# Patient Record
Sex: Female | Born: 1935 | Race: White | Hispanic: No | Smoking: Never smoker
Health system: Southern US, Community
[De-identification: ages and names within clinical notes are randomized; demographics above are authoritative.]

## PROBLEM LIST (undated history)

## (undated) DIAGNOSIS — K219 Gastro-esophageal reflux disease without esophagitis: Secondary | ICD-10-CM

## (undated) DIAGNOSIS — M810 Age-related osteoporosis without current pathological fracture: Secondary | ICD-10-CM

## (undated) DIAGNOSIS — I10 Essential (primary) hypertension: Secondary | ICD-10-CM

## (undated) HISTORY — PX: CATARACT EXTRACTION: SUR2

## (undated) HISTORY — DX: Essential (primary) hypertension: I10

## (undated) HISTORY — DX: Age-related osteoporosis without current pathological fracture: M81.0

## (undated) HISTORY — DX: Gastro-esophageal reflux disease without esophagitis: K21.9

---

## 2020-09-15 ENCOUNTER — Other Ambulatory Visit: Payer: Self-pay

## 2020-09-15 ENCOUNTER — Emergency Department: Payer: Medicare Other

## 2020-09-15 ENCOUNTER — Emergency Department
Admission: EM | Admit: 2020-09-15 | Discharge: 2020-09-15 | Disposition: A | Discharge status: Home / Self Care | Payer: Medicare Other | Attending: Emergency Medicine | Admitting: Emergency Medicine

## 2020-09-15 DIAGNOSIS — K5900 Constipation, unspecified: Secondary | ICD-10-CM | POA: Diagnosis not present

## 2020-09-15 DIAGNOSIS — R509 Fever, unspecified: Secondary | ICD-10-CM | POA: Insufficient documentation

## 2020-09-15 DIAGNOSIS — W19XXXD Unspecified fall, subsequent encounter: Secondary | ICD-10-CM | POA: Diagnosis not present

## 2020-09-15 DIAGNOSIS — Z20822 Contact with and (suspected) exposure to covid-19: Secondary | ICD-10-CM | POA: Diagnosis not present

## 2020-09-15 DIAGNOSIS — E876 Hypokalemia: Secondary | ICD-10-CM | POA: Insufficient documentation

## 2020-09-15 DIAGNOSIS — R519 Headache, unspecified: Secondary | ICD-10-CM | POA: Insufficient documentation

## 2020-09-15 DIAGNOSIS — S199XXD Unspecified injury of neck, subsequent encounter: Secondary | ICD-10-CM | POA: Insufficient documentation

## 2020-09-15 DIAGNOSIS — R110 Nausea: Secondary | ICD-10-CM | POA: Insufficient documentation

## 2020-09-15 DIAGNOSIS — E860 Dehydration: Secondary | ICD-10-CM | POA: Insufficient documentation

## 2020-09-15 LAB — URINALYSIS, COMPLETE (UACMP) WITH MICROSCOPIC
Bilirubin Urine: NEGATIVE
Glucose, UA: NEGATIVE mg/dL
Ketones, ur: 20 mg/dL — AB
Leukocytes,Ua: NEGATIVE
Nitrite: NEGATIVE
Protein, ur: NEGATIVE mg/dL
Specific Gravity, Urine: 1.023 (ref 1.005–1.030)
pH: 5 (ref 5.0–8.0)

## 2020-09-15 LAB — COMPREHENSIVE METABOLIC PANEL
ALT: 13 U/L (ref 0–44)
AST: 22 U/L (ref 15–41)
Albumin: 3.8 g/dL (ref 3.5–5.0)
Alkaline Phosphatase: 56 U/L (ref 38–126)
Anion gap: 12 (ref 5–15)
BUN: 20 mg/dL (ref 8–23)
CO2: 23 mmol/L (ref 22–32)
Calcium: 9 mg/dL (ref 8.9–10.3)
Chloride: 91 mmol/L — ABNORMAL LOW (ref 98–111)
Creatinine, Ser: 0.69 mg/dL (ref 0.44–1.00)
GFR, Estimated: >60 mL/min (ref >60)
Glucose, Bld: 95 mg/dL (ref 70–99)
Potassium: 2.9 mmol/L — ABNORMAL LOW (ref 3.5–5.1)
Sodium: 126 mmol/L — ABNORMAL LOW (ref 135–145)
Total Bilirubin: 1 mg/dL (ref 0.3–1.2)
Total Protein: 6.8 g/dL (ref 6.5–8.1)

## 2020-09-15 LAB — RESP PANEL BY RT-PCR (FLU A&B, COVID) ARPGX2
Influenza A by PCR: NEGATIVE
Influenza B by PCR: NEGATIVE
SARS Coronavirus 2 by RT PCR: NEGATIVE

## 2020-09-15 LAB — CBC
HCT: 37.7 % (ref 36.0–46.0)
Hemoglobin: 13.4 g/dL (ref 12.0–15.0)
MCH: 31 pg (ref 26.0–34.0)
MCHC: 35.5 g/dL (ref 30.0–36.0)
MCV: 87.3 fL (ref 80.0–100.0)
Platelets: 246 10*3/uL (ref 150–400)
RBC: 4.32 MIL/uL (ref 3.87–5.11)
RDW: 12.6 % (ref 11.5–15.5)
WBC: 7.7 10*3/uL (ref 4.0–10.5)
nRBC: 0 % (ref 0.0–0.2)

## 2020-09-15 LAB — LIPASE, BLOOD: Lipase: 43 U/L (ref 11–51)

## 2020-09-15 LAB — TROPONIN I (HIGH SENSITIVITY): Troponin I (High Sensitivity): 6 ng/L (ref <18)

## 2020-09-15 LAB — CK: Total CK: 33 U/L — ABNORMAL LOW (ref 38–234)

## 2020-09-15 MED ORDER — POTASSIUM CHLORIDE CRYS ER 20 MEQ PO TBCR
80.0000 meq | EXTENDED_RELEASE_TABLET | Freq: Once | ORAL | Status: AC
  Administered 2020-09-15: 80 meq via ORAL
  Filled 2020-09-15: qty 4

## 2020-09-15 MED ORDER — SENNA 8.6 MG PO TABS
1.0000 | ORAL_TABLET | Freq: Once | ORAL | Status: AC
  Administered 2020-09-15: 8.6 mg via ORAL
  Filled 2020-09-15: qty 1

## 2020-09-15 MED ORDER — ONDANSETRON HCL 4 MG PO TABS: 4.0000 mg | ORAL_TABLET | Freq: Three times a day (TID) | ORAL | 0 refills | Status: DC | PRN

## 2020-09-15 MED ORDER — POLYETHYLENE GLYCOL 3350 17 G PO PACK: 17.0000 g | PACK | Freq: Every day | ORAL | 0 refills | Status: AC

## 2020-09-15 MED ORDER — ONDANSETRON HCL 4 MG/2ML IJ SOLN
4.0000 mg | Freq: Once | INTRAMUSCULAR | Status: AC
  Administered 2020-09-15: 4 mg via INTRAVENOUS
  Filled 2020-09-15: qty 2

## 2020-09-15 MED ORDER — LACTATED RINGERS IV BOLUS
500.0000 mL | Freq: Once | INTRAVENOUS | Status: AC
  Administered 2020-09-15: 500 mL via INTRAVENOUS

## 2020-09-15 NOTE — ED Provider Notes (Signed)
[facility] Emergency Department Provider Note  ____________________________________________   Event Date/Time   First MD Initiated Contact with Patient 09/15/20 [phone]     (approximate)  I have reviewed the triage vital signs and the nursing notes.   HISTORY  Chief Complaint Nausea   HPI Connie Schwartz is a 87 y.o. female reportedly with a past medical history of several vertebral cervical body fractures after recent fall several weeks ago (patient states she does not know exactly when she fell and was seen at [facility] before being transferred to [facility] clinic) currently residing at home and fairly independent wearing a c-collar and T SLO brace at all times presents for assessment of some nausea she experienced when she woke up this morning feeling hot and sweaty.  She thinks she may have had a little burning with urination of the last day but is not sure if this was related to some vaginal dryness or not.  She also notes she has not involvement in a little over a week and is not sure if this was related to pain medicine she was on or not.  She denies any recent falls or subsequent falls after being discharged from [place] clinic several weeks ago.  She denies any chest pain, cough, shortness of breath, headache, earache, sore throat, diarrhea, blood in her urine, rash or extremity pain weakness numbness or tingling.  She does endorse some pain in her back and neck from her prior fall but no acute change.         No past medical history on file.  There are no problems to display for this patient.     Prior to Admission medications   Medication Sig Start Date End Date Taking? Authorizing Provider  ondansetron (ZOFRAN) 4 MG tablet Take 1 tablet (4 mg total) by mouth every 8 (eight) hours as needed for up to 10 doses for nausea or vomiting. 09/15/20  Yes Gilles Chiquito, MD  polyethylene glycol (MIRALAX) 17 g packet Take 17 g by mouth daily. 09/15/20  10/15/20 Yes Gilles Chiquito, MD    Allergies Celebrex [celecoxib], Bactrim [sulfamethoxazole-trimethoprim], Doxycycline, and Penicillins  No family history on file.  Social History    Review of Systems  Review of Systems  Constitutional: Positive for fever ( subjective) and malaise/fatigue. Negative for chills.  HENT: Negative for sore throat.   Eyes: Positive for double vision ( "for several weeks, only when both eyes open). Negative for pain.  Respiratory: Negative for cough and stridor.   Cardiovascular: Negative for chest pain.  Gastrointestinal: Positive for nausea. Negative for vomiting.  Genitourinary: Positive for dysuria.  Musculoskeletal: Positive for back pain ( over last several weeks since fall) and neck pain ( over last several weeks since fall).  Skin: Negative for rash.  Neurological: Negative for seizures, loss of consciousness and headaches.  Psychiatric/Behavioral: Negative for suicidal ideas.  All other systems reviewed and are negative.     ____________________________________________   PHYSICAL EXAM:  VITAL SIGNS: ED Triage Vitals  Enc Vitals Group     BP 09/15/20 0947 (!) 143/118     Pulse Rate 09/15/20 0947 100     Resp 09/15/20 0947 16     Temp 09/15/20 0947 97.9 F (36.6 C)     Temp Source 09/15/20 0947 Oral     SpO2 09/15/20 0947 96 %     Weight 09/15/20 0944 105 lb (47.6 kg)     Height 09/15/20 0944 5\' 3"  (1.6 m)  Head Circumference --      Peak Flow --      Pain Score 09/15/20 0944 0     Pain Loc --      Pain Edu? --      Excl. in GC? --    Vitals:   09/15/20 1330 09/15/20 1400  BP: (!) 143/60 (!) 136/50  Pulse: 67 75  Resp: 17 20  Temp:    SpO2: 100% 100%   Physical Exam Vitals and nursing note reviewed.  Constitutional:      General: She is not in acute distress.    Appearance: She is well-developed.  HENT:     Head: Normocephalic and atraumatic.     Right Ear: External ear normal.     Left Ear: External ear  normal.     Nose: Nose normal.  Eyes:     Conjunctiva/sclera: Conjunctivae normal.  Cardiovascular:     Rate and Rhythm: Normal rate and regular rhythm.     Heart sounds: No murmur heard.   Pulmonary:     Effort: Pulmonary effort is normal. No respiratory distress.     Breath sounds: Normal breath sounds.  Abdominal:     Palpations: Abdomen is soft.     Tenderness: There is no abdominal tenderness.  Musculoskeletal:     Cervical back: Neck supple.  Skin:    General: Skin is warm and dry.     Capillary Refill: Capillary refill takes less than 2 seconds.  Neurological:     Mental Status: She is alert and oriented to person, place, and time.  Psychiatric:        Mood and Affect: Mood normal.     No focal tenderness over the CT or L-spine.  2+ bilateral radial pulses.  PERRLA.  EOMI.  Cranial nerves II through XII grossly intact.  Patient has full and symmetric strength on her bilateral upper and lower extremities.  ____________________________________________   LABS (all labs ordered are listed, but only abnormal results are displayed)  Labs Reviewed  COMPREHENSIVE METABOLIC PANEL - Abnormal; Notable for the following components:      Result Value   Sodium 126 (*)    Potassium 2.9 (*)    Chloride 91 (*)    All other components within normal limits  URINALYSIS, COMPLETE (UACMP) WITH MICROSCOPIC - Abnormal; Notable for the following components:   Color, Urine YELLOW (*)    APPearance HAZY (*)    Hgb urine dipstick MODERATE (*)    Ketones, ur 20 (*)    Bacteria, UA FEW (*)    All other components within normal limits  CK - Abnormal; Notable for the following components:   Total CK 33 (*)    All other components within normal limits  RESP PANEL BY RT-PCR (FLU A&B, COVID) ARPGX2  LIPASE, BLOOD  CBC  TROPONIN I (HIGH SENSITIVITY)  TROPONIN I (HIGH SENSITIVITY)   ____________________________________________  EKG  Sinus rhythm with a ventricular rate of 88, normal  axis, unremarkable intervals, no clear evidence of acute ischemia or other significant underlying arrhythmia. ____________________________________________  RADIOLOGY  ED MD interpretation: Chest x-ray is unremarkable as is CT head.  Official radiology report(s): DG Chest 2 View  Result Date: 09/15/2020 CLINICAL DATA:  Nausea.  To kidney a. Weakness. EXAM: CHEST - 2 VIEW COMPARISON:  None. FINDINGS: Artifact overlies the chest. Heart size is normal. Aortic atherosclerotic calcification is present. Pulmonary vascularity is normal. The lungs are clear. No infiltrate, collapse or effusion. No significant bone finding.  IMPRESSION: No active disease. Aortic atherosclerosis. Electronically Signed   By: Paulina Fusi M.D.   On: 09/15/2020 13:02   CT Head Wo Contrast  Result Date: 09/15/2020 CLINICAL DATA:  Nauseated and weak EXAM: CT HEAD WITHOUT CONTRAST TECHNIQUE: Contiguous axial images were obtained from the base of the skull through the vertex without intravenous contrast. COMPARISON:  None. FINDINGS: Brain: No evidence of acute infarction, hemorrhage, extra-axial collection, ventriculomegaly, or mass effect. Generalized cerebral atrophy. Periventricular white matter low attenuation likely secondary to microangiopathy. Vascular: Cerebrovascular atherosclerotic calcifications are noted. Skull: Negative for fracture or focal lesion. Sinuses/Orbits: Visualized portions of the orbits are unremarkable. Visualized portions of the paranasal sinuses are unremarkable. Visualized portions of the mastoid air cells are unremarkable. Other: None. IMPRESSION: 1. No acute intracranial pathology. 2. Chronic microvascular disease and cerebral atrophy. Electronically Signed   By: Elige Ko   On: 09/15/2020 13:45    ____________________________________________   PROCEDURES  Procedure(s) performed (including Critical Care):  .1-3 Lead EKG Interpretation Performed by: Gilles Chiquito, MD Authorized by: Gilles Chiquito, MD     Interpretation: normal     ECG rate assessment: normal     Rhythm: sinus rhythm     Ectopy: none     Conduction: normal       ____________________________________________   INITIAL IMPRESSION / ASSESSMENT AND PLAN / ED COURSE       Patient presents with above history and exam for assessment of some nausea and subjective fevers today.  This is in the setting of spine fractures from recent fall.  Patient is somewhat poor historian but denies any other clear acute symptoms.  She is in a c-collar and she has a low brace although unable to view any records from [place].  Initial differential includes acute infectious process, metabolic derangements, atypical presentation for ACS, arrhythmia, possible ileus.  Low suspicion for SBO as patient states she is passing gas and has not actually vomited and her abdomen is soft throughout.  However she does endorse significant constipation and I discussed importance of initiating a bowel regimen with Colace senna and MiraLAX.  Patient does not think she has had any recent falls she is a somewhat poor historian and a CT head was obtained that did not show any evidence of acute injury.  In addition chest x-ray shows no evidence of pneumonia or other acute intrathoracic process to explain patient's nausea.  CMP remarkable for mild hyponatremia with NA 126 and hypokalemia with a potassium of 2.9 with no other significant electrolyte or metabolic derangements.  No evidence of cholestasis or hepatitis.  Patient has no tenderness on upper quadrant to suggest symptomatic gallstones.  Lipase is 43 not consistent with acute pancreatitis.  CBC shows no leukocytosis or acute anemia.  UA has some hemoglobin and ketones consistent with mild dehydration but no evidence of infection.  Given absence of fever elevated white blood cell count and no other foci of infection on exam or chest x-ray were low suspicion for acute infectious process.  Troponin is  nonelevated at 6 and this was obtained greater than 3 hours after symptom onset and overall I have very low suspicion for ACS given multiple reassuring EKG.  CK is unremarkable.  Certainly possible patient is little dehydrated and has a small ileus although she is tolerating p.o.  She was given some Zofran and had resolution of her nausea.  Given stable vitals with otherwise reassuring exam and work-up and patient tolerating p.o. and making flatus without  any abdominal pain I believe she is safe for discharge with plan for continued outpatient PCP follow-up.  Discharge stable condition.  Strict return cautions advised and discussed.  Emphasized importance of adequate hydration and initiation of bowel regimen including MiraLAX and Colace.  Patient and family at bedside voiced understanding and agreement with this plan.  Discharged stable condition.  Strict impressions advised and discussed after some IV fluids and Zofran.      ____________________________________________   FINAL CLINICAL IMPRESSION(S) / ED DIAGNOSES  Final diagnoses:  Constipation, unspecified constipation type  Nausea  Subjective fever  Injury of neck, subsequent encounter  Hypokalemia  Dehydration    Medications  lactated ringers bolus 500 mL (0 mLs Intravenous Stopped 09/15/20 1403)  potassium chloride SA (KLOR-CON) CR tablet 80 mEq (80 mEq Oral Given 09/15/20 1247)  ondansetron (ZOFRAN) injection 4 mg (4 mg Intravenous Given 09/15/20 1247)  senna (SENOKOT) tablet 8.6 mg (8.6 mg Oral Given 09/15/20 1420)     ED Discharge Orders         Ordered    ondansetron (ZOFRAN) 4 MG tablet  Every 8 hours PRN        09/15/20 1404    polyethylene glycol (MIRALAX) 17 g packet  Daily        09/15/20 1404           Note:  This document was prepared using Dragon voice recognition software and may include unintentional dictation errors.   Gilles ChiquitoSmith, Everlee Quakenbush P, MD 09/15/20 [phone]

## 2020-09-15 NOTE — ED Triage Notes (Signed)
Pt to ED via POV, pt states that she woke up this morning and felt very hot. Pt reports that she has also been feeling nauseated and weak. Pt family reports no recent BMs in the last week and a half. Pt recently hospitalized for neck fracture. Pt denies taking narcotic pain medication for the neck pain. Pt denies abdominal pain at this time.

## 2020-09-18 DIAGNOSIS — S22010A Wedge compression fracture of first thoracic vertebra, initial encounter for closed fracture: Secondary | ICD-10-CM | POA: Insufficient documentation

## 2020-09-18 DIAGNOSIS — S129XXA Fracture of neck, unspecified, initial encounter: Secondary | ICD-10-CM | POA: Insufficient documentation

## 2020-09-25 ENCOUNTER — Telehealth: Payer: Self-pay

## 2020-09-25 NOTE — Telephone Encounter (Signed)
Has new pt appt with [facility] in june  Copied from CRM #[id number]. Topic: General - Other >> Sep 22, 2020  4:15 PM Pawlus, Gifford Shave wrote: Pts daughter called stating they really need to get Home Health care orders and cannot wait until June, please advise if this can be done. >> Sep 22, 2020  6:07 PM Pawlus, Maxine Glenn A wrote: Pt is over the age of 57 but needed to get home health orders put in place.

## 2020-09-25 NOTE — Telephone Encounter (Signed)
Called gave message

## 2020-09-26 ENCOUNTER — Ambulatory Visit: Payer: PRIVATE HEALTH INSURANCE | Admitting: Family Medicine

## 2020-12-22 ENCOUNTER — Ambulatory Visit: Payer: Medicare Other | Admitting: Internal Medicine

## 2021-01-12 DIAGNOSIS — K589 Irritable bowel syndrome without diarrhea: Secondary | ICD-10-CM | POA: Insufficient documentation

## 2021-01-12 DIAGNOSIS — K219 Gastro-esophageal reflux disease without esophagitis: Secondary | ICD-10-CM | POA: Insufficient documentation

## 2022-03-20 IMAGING — CR DG CHEST 2V
2 series · 2 of 2 positions shown · non-contrast
Comparison: None.

CLINICAL DATA: Nausea.  To kidney a. Weakness.

EXAM:
CHEST - 2 VIEW

[chest lat]
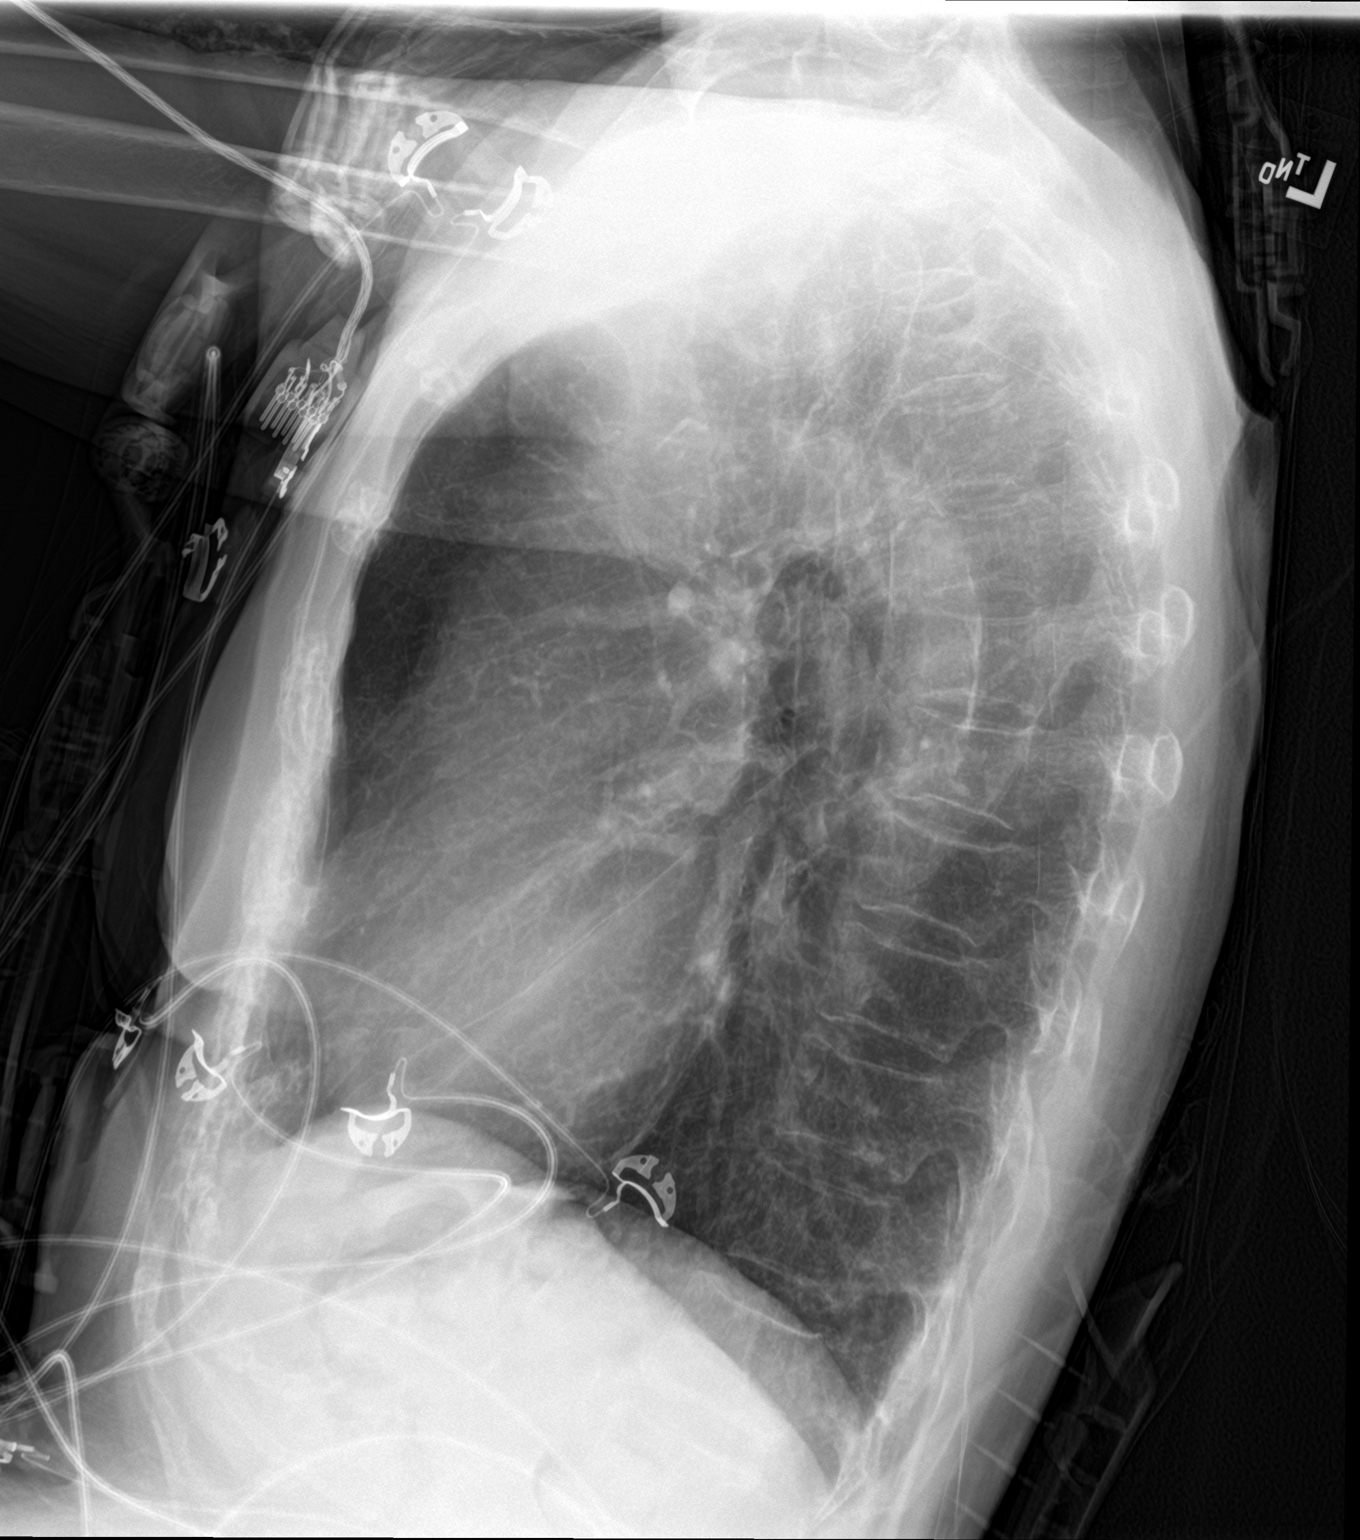

[chest ap]
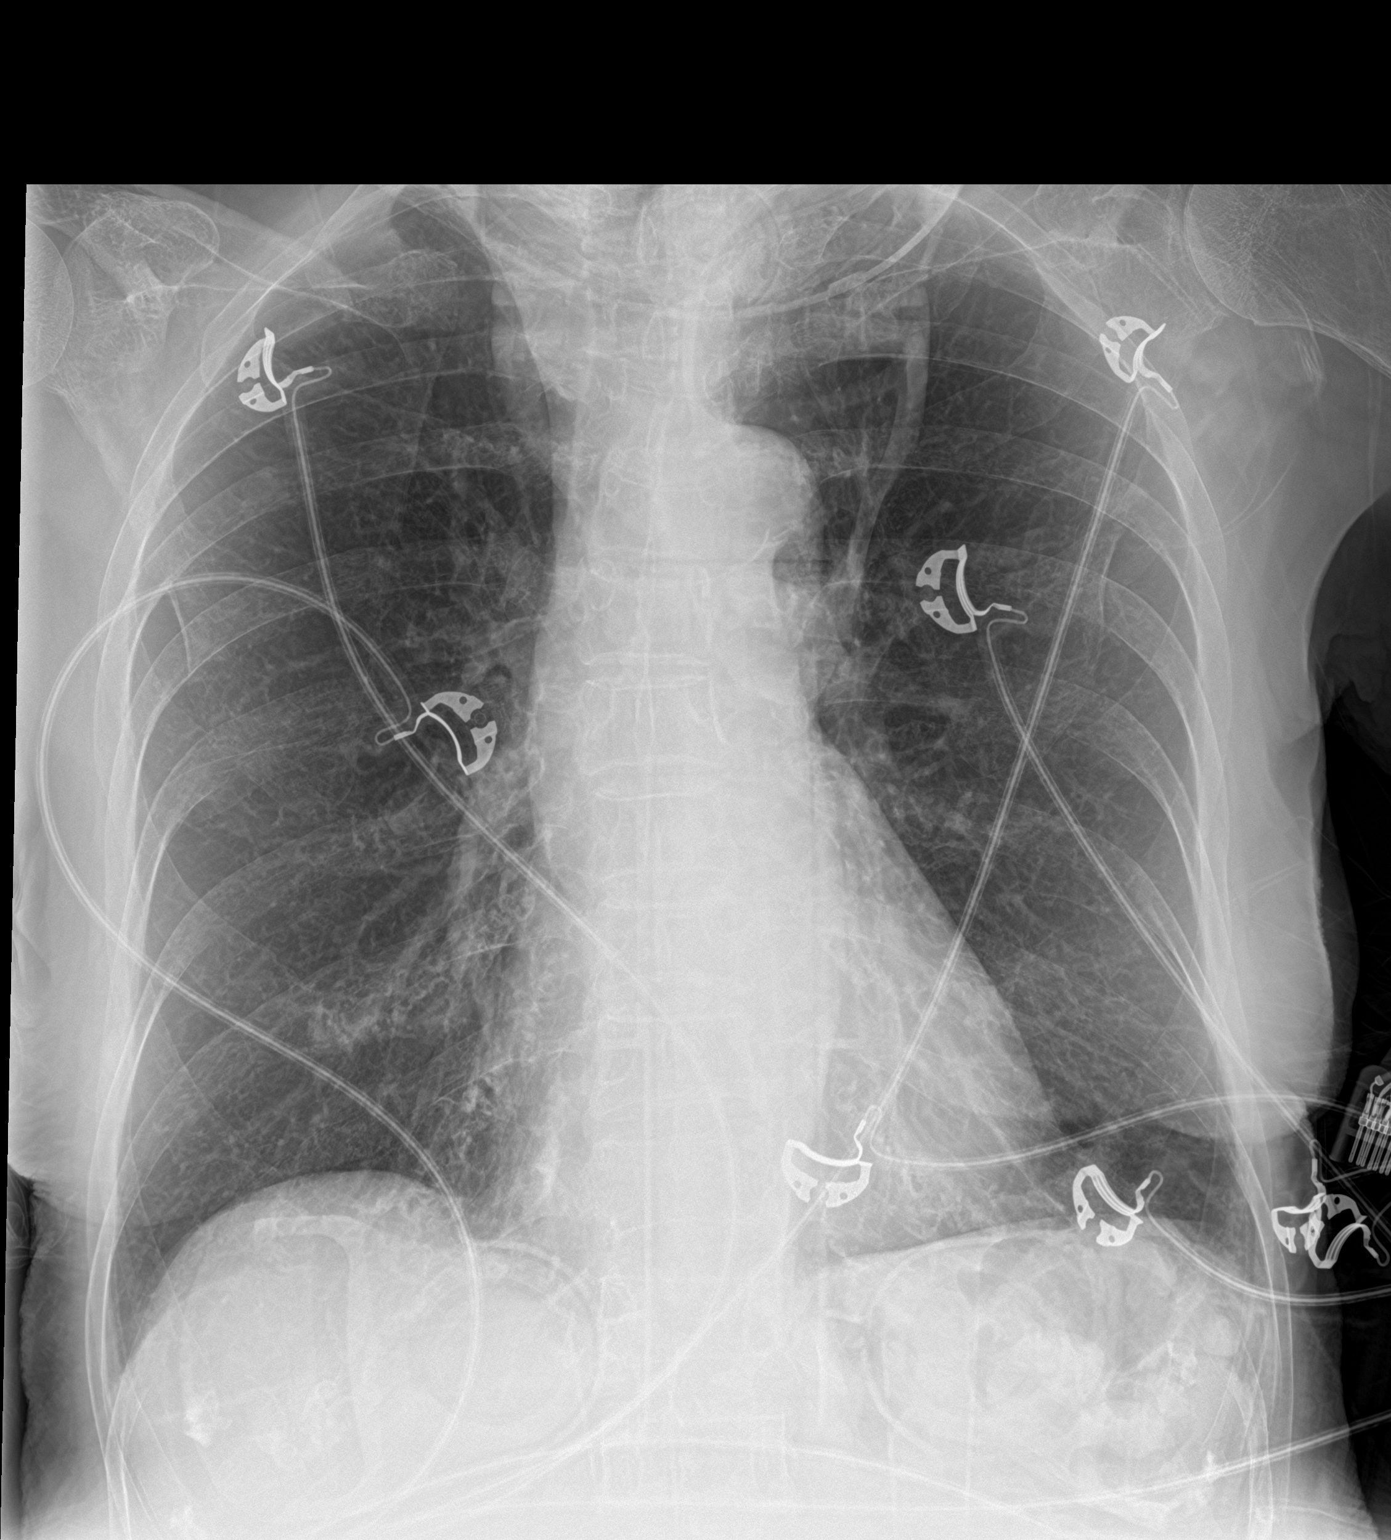

[2 of 2 positions shown; findings below may reference images not displayed]

FINDINGS: Artifact overlies the chest. Heart size is normal. Aortic
atherosclerotic calcification is present. Pulmonary vascularity is
normal. The lungs are clear. No infiltrate, collapse or effusion. No
significant bone finding.
IMPRESSION: No active disease. Aortic atherosclerosis.

## 2022-03-20 IMAGING — CT CT HEAD W/O CM
3 series · 16 of 47 positions shown, 19 images · non-contrast
Comparison: None.

CLINICAL DATA: Nauseated and weak

EXAM:
CT HEAD WITHOUT CONTRAST
TECHNIQUE: Contiguous axial images were obtained from the base of the skull
through the vertex without intravenous contrast.

[Series 3: head wo · axial · 0.44mm/px · z∈[-93,+32]mm · 10 of 31 slices shown, 13 images]
[im 3/31  brain]
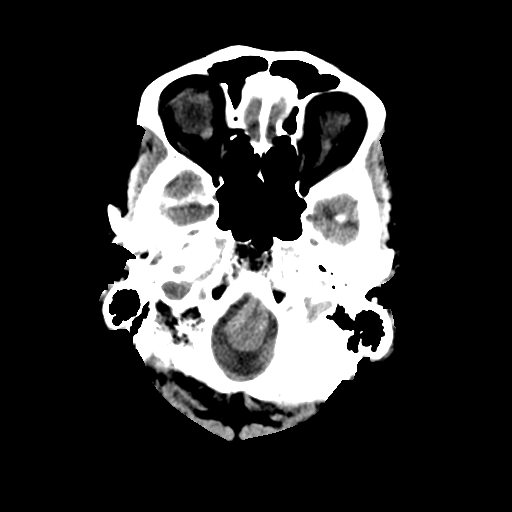
[im 3/31  bone]
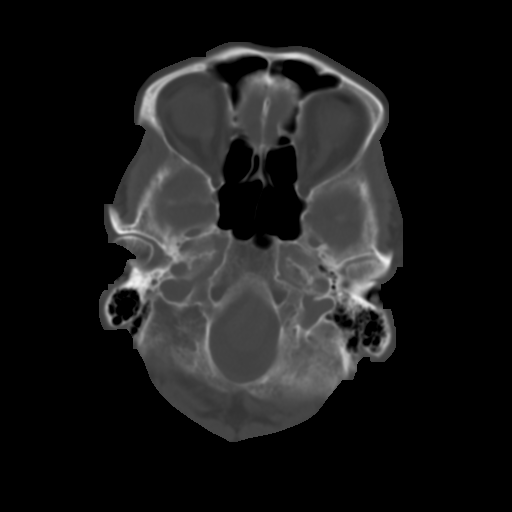
[im 6/31  brain]
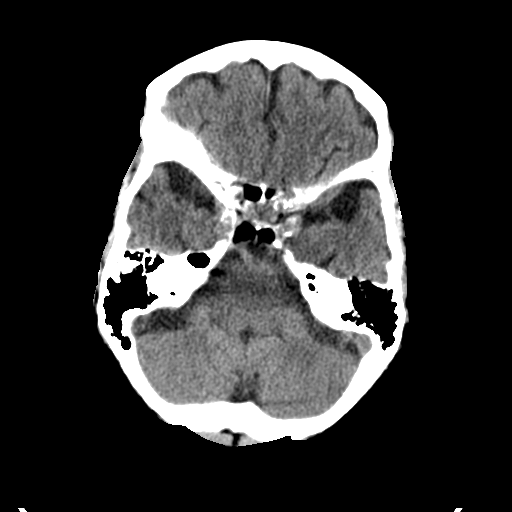
[im 9/31  brain]
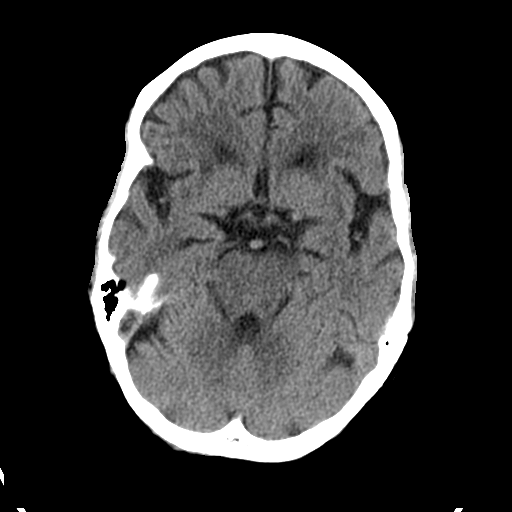
[im 11/31  brain]
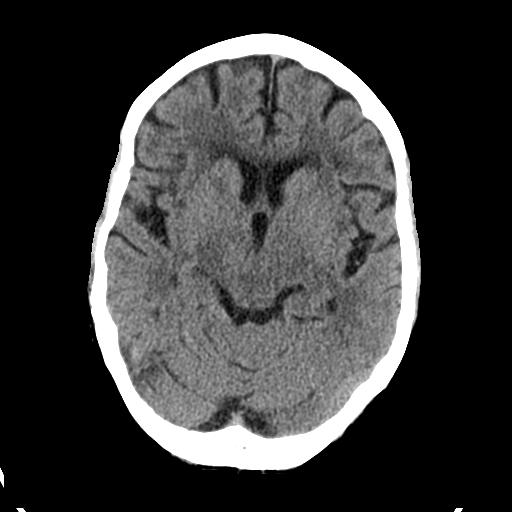
[im 14/31  brain]
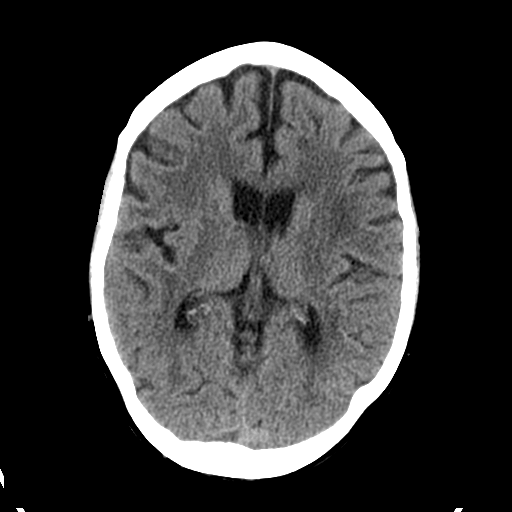
[im 14/31  bone]
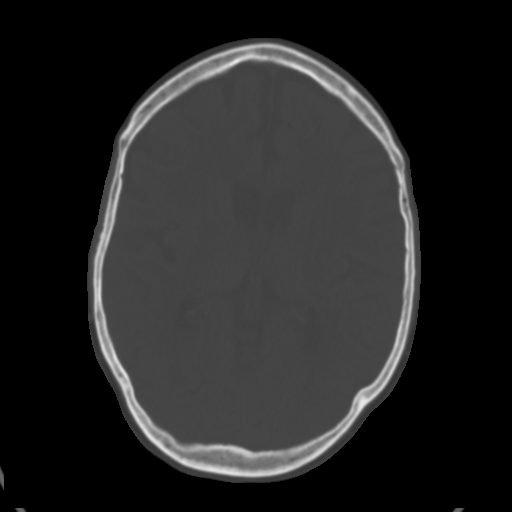
[im 17/31  brain]
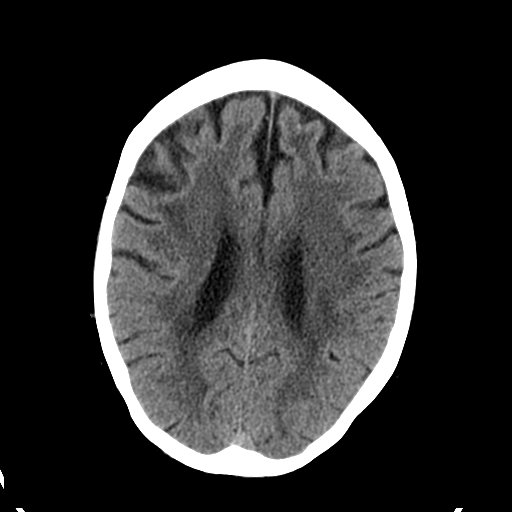
[im 20/31  brain]
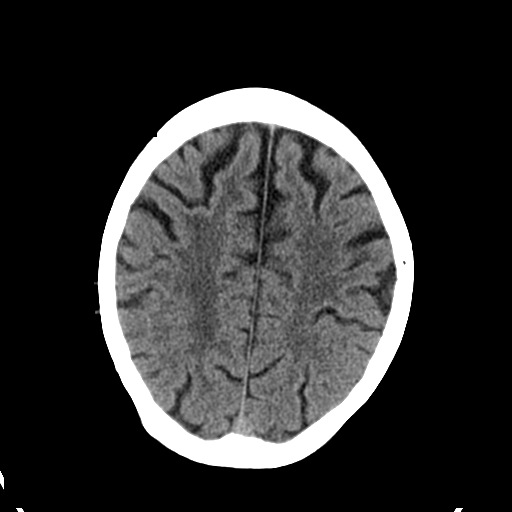
[im 23/31  brain]
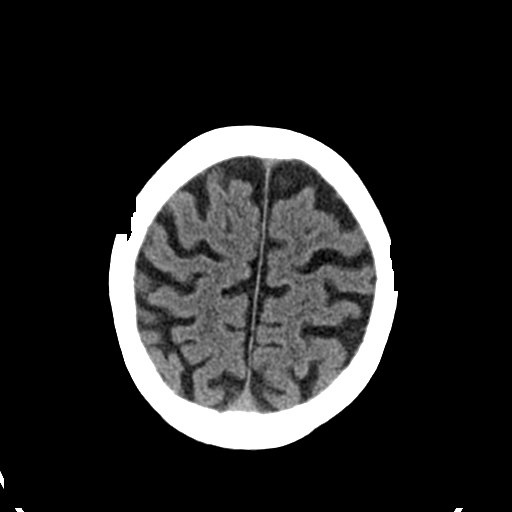
[im 25/31  brain]
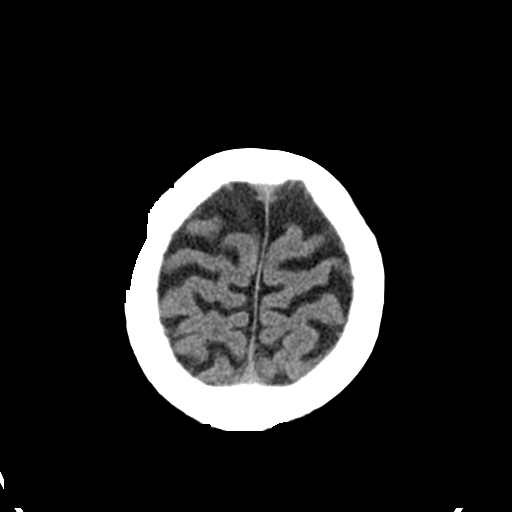
[im 25/31  bone]
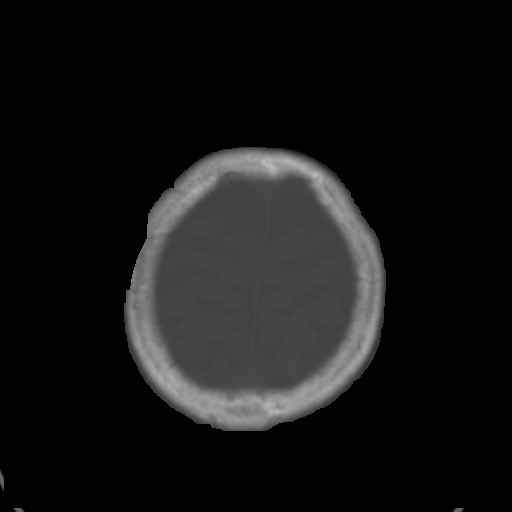
[im 28/31  brain]
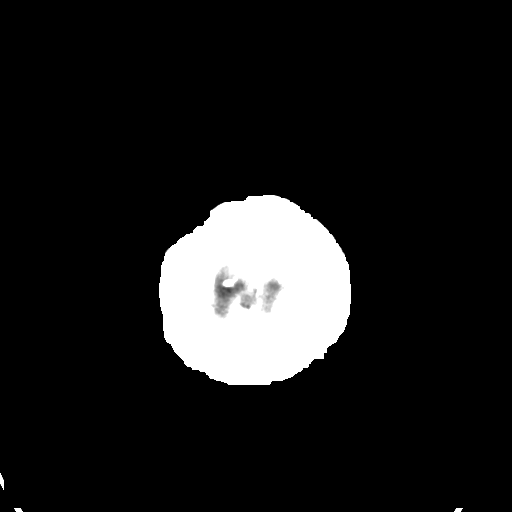

[Series 4: coronal soft tissue · coronal · 0.34mm/px · 3 of 68 slices shown]
[im 23/68  brain]
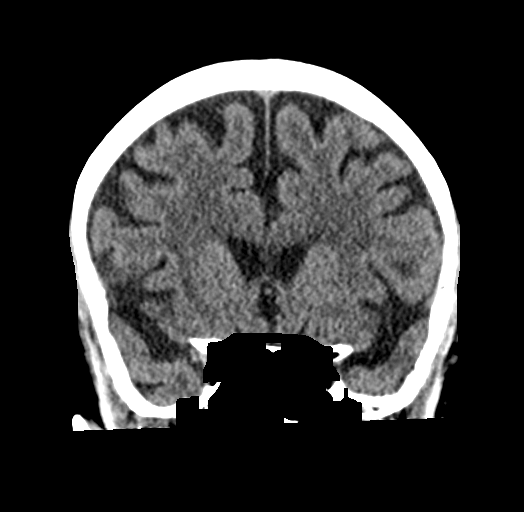
[im 30/68  brain]
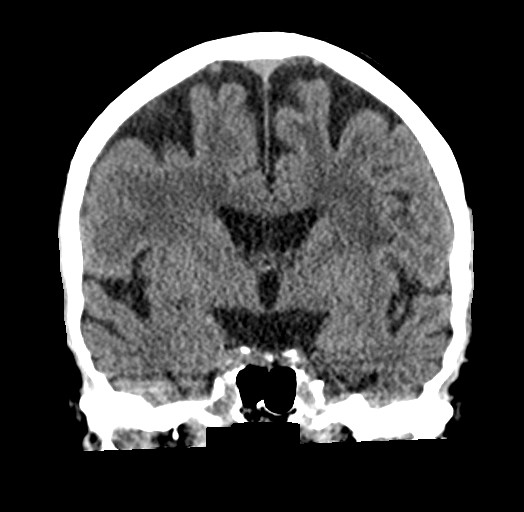
[im 38/68  brain]
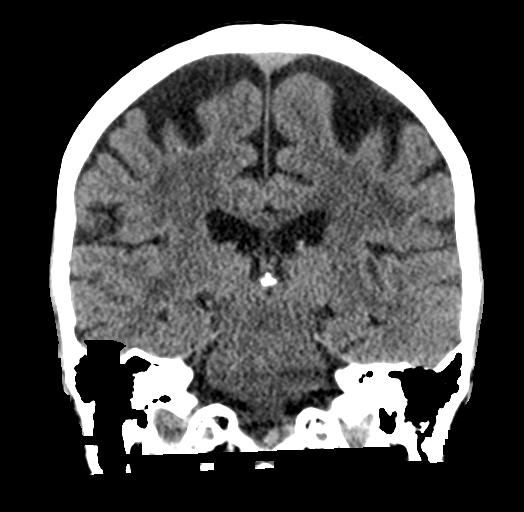

[Series 5: sagittal soft tissue · sagittal · 0.34mm/px · 3 of 53 slices shown]
[im 18/53  brain]
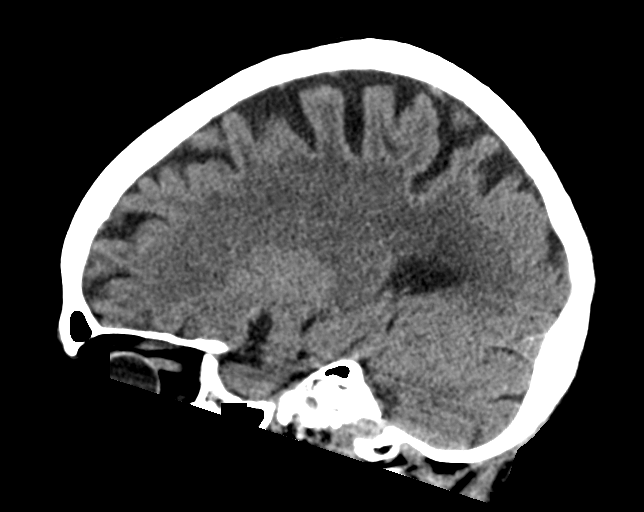
[im 27/53  brain]
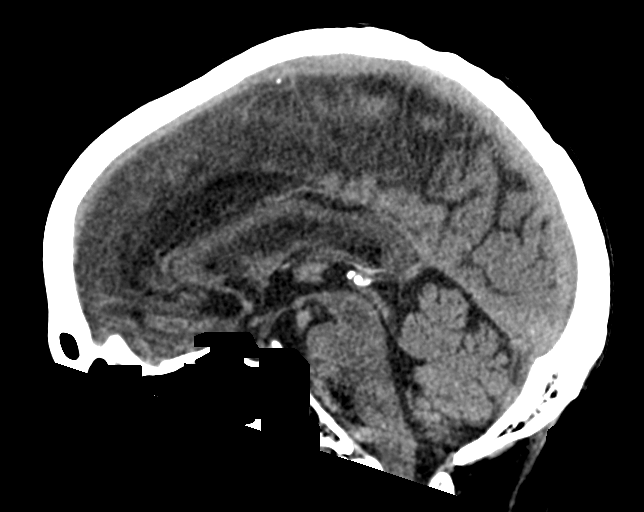
[im 35/53  brain]
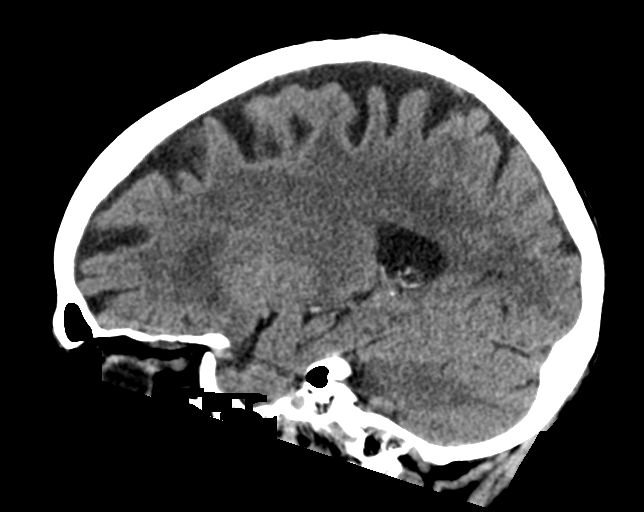

[16 of 47 positions shown; findings below may reference images not displayed]

FINDINGS: Brain: No evidence of acute infarction, hemorrhage, extra-axial
collection, ventriculomegaly, or mass effect. Generalized cerebral
atrophy. Periventricular white matter low attenuation likely
secondary to microangiopathy.

Vascular: Cerebrovascular atherosclerotic calcifications are noted.

Skull: Negative for fracture or focal lesion.

Sinuses/Orbits: Visualized portions of the orbits are unremarkable.
Visualized portions of the paranasal sinuses are unremarkable.
Visualized portions of the mastoid air cells are unremarkable.

Other: None.
IMPRESSION: 1. No acute intracranial pathology.
2. Chronic microvascular disease and cerebral atrophy.

## 2022-12-25 ENCOUNTER — Ambulatory Visit: Payer: Medicare Other | Attending: Internal Medicine | Admitting: Internal Medicine

## 2022-12-25 ENCOUNTER — Encounter: Payer: Self-pay | Admitting: Internal Medicine

## 2022-12-25 VITALS — BP 152/70 | HR 70 | Ht 62.5 in | Wt 117.0 lb

## 2022-12-25 DIAGNOSIS — R931 Abnormal findings on diagnostic imaging of heart and coronary circulation: Secondary | ICD-10-CM | POA: Diagnosis not present

## 2022-12-25 DIAGNOSIS — R0602 Shortness of breath: Secondary | ICD-10-CM | POA: Diagnosis present

## 2022-12-25 DIAGNOSIS — I10 Essential (primary) hypertension: Secondary | ICD-10-CM | POA: Diagnosis not present

## 2022-12-25 NOTE — Patient Instructions (Signed)
Medication Instructions:  Your physician recommends that you continue on your current medications as directed. Please refer to the Current Medication list given to you today.    *If you need a refill on your cardiac medications before your next appointment, please call your pharmacy*   Lab Work: None ordered today   Testing/Procedures: Your physician has requested that you have an echocardiogram. Echocardiography is a painless test that uses sound waves to create images of your heart. It provides your doctor with information about the size and shape of your heart and how well your heart's chambers and valves are working.   You may receive an ultrasound enhancing agent through an IV if needed to better visualize your heart during the echo. This procedure takes approximately one hour.  There are no restrictions for this procedure.  This will take place at [address], [place] [address]  Follow-Up: At [facility], you and your health needs are our priority.  As part of our continuing mission to provide you with exceptional heart care, we have created designated Provider Care Teams.  These Care Teams include your primary Cardiologist (physician) and Advanced Practice Providers (APPs -  Physician Assistants and Nurse Practitioners) who all work together to provide you with the care you need, when you need it.  We recommend signing up for the patient portal called "MyChart".  Sign up information is provided on this After Visit Summary.  MyChart is used to connect with patients for Virtual Visits (Telemedicine).  Patients are able to view lab/test results, encounter notes, upcoming appointments, etc.  Non-urgent messages can be sent to your provider as well.   To learn more about what you can do with MyChart, go to [email or web address].    Your next appointment:   1 month(s)  Provider:   You may see Yvonne Kendall, MD or one of the following  Advanced Practice Providers on your designated Care Team:   Nicolasa Ducking, NP Eula Listen, PA-C Cadence Fransico Michael, PA-C Charlsie Quest, NP

## 2022-12-25 NOTE — Progress Notes (Unsigned)
New Outpatient Visit Date: 12/25/2022  Referring Provider: Gwenevere Ghazi, NP [facility]  Chief Complaint: Abnormal echocardiogram  HPI:  Connie Schwartz is a 89 y.o. female who is being seen today for the evaluation of moderate basal septal hypertrophy noted on echocardiogram at the request of Connie Schwartz. She has a history of hypertension, GERD, and osteoporosis complicated by multiple compression fractures in the setting of falls.  Today, she feels well, complaining only of mild shortness of breath that she describes as sometimes having difficulty taking a deep breath.  She denies chest pain, palpitations, lightheadedness, and edema.  She mentions several times that she has intermittent headaches, most pronounced when she lies back in bed.  She does not recall why echocardiograms have been performed at her assisted living facility.  Formal echo reports and images are not available for review today.  She denies a history of heart disease.  --------------------------------------------------------------------------------------------------  Cardiovascular History & Procedures: Cardiovascular Problems: Shortness of breath Question abnormal echocardiogram  Risk Factors: Hypertension and age greater than 78  Cath/PCI: None  CV Surgery: None  EP Procedures and Devices: None  Non-Invasive Evaluation(s): None  Recent CV Pertinent Labs: Lab Results  Component Value Date   K 2.9 (L) 09/15/2020   BUN 20 09/15/2020   CREATININE 0.69 09/15/2020    --------------------------------------------------------------------------------------------------  Past Medical History:  Diagnosis Date   GERD (gastroesophageal reflux disease)    Hypertension    Osteoporosis     Past Surgical History:  Procedure Laterality Date   CATARACT EXTRACTION Bilateral     Current Meds  Medication Sig   alendronate (FOSAMAX) 70 MG tablet Take 70 mg by mouth once a week.   amLODipine  (NORVASC) 2.5 MG tablet Take 2.5 mg by mouth daily.   Biotin 2500 MCG CHEW Chew 1 tablet by mouth daily.   Calcium Citrate-Vitamin D (CITRACAL + D PO) Take 1 tablet by mouth daily.   Cholecalciferol (VITAMIN D3) 50 MCG (2000 UT) capsule Take 2,000 Units by mouth daily.   fluticasone (FLONASE) 50 MCG/ACT nasal spray Place 1 spray into both nostrils daily.   pantoprazole (PROTONIX) 40 MG tablet Take 40 mg by mouth daily.   raloxifene (EVISTA) 60 MG tablet Take 60 mg by mouth daily.   senna (SENOKOT) 8.6 MG tablet Take 1 tablet by mouth 2 (two) times daily.    Allergies: Celebrex [celecoxib], Bactrim [sulfamethoxazole-trimethoprim], Doxycycline, and Penicillins  Social History   Tobacco Use   Smoking status: Never   Smokeless tobacco: Never  Vaping Use   Vaping Use: Never used  Substance Use Topics   Alcohol use: Never   Drug use: Never    Family History  Problem Relation Age of Onset   Heart failure Mother 41   Multiple myeloma Father    Cancer Sister     Review of Systems: A 12-system review of systems was performed and was negative except as noted in the HPI.  --------------------------------------------------------------------------------------------------  Physical Exam: BP (!) 160/80 (BP Location: Right Arm, Patient Position: Sitting, Cuff Size: Normal)   Pulse 70   Ht 5' 2.5" (1.588 m)   Wt 117 lb (53.1 kg)   SpO2 97%   BMI 21.06 kg/m  Repeat BP: 152/70  General:  NAD HEENT: No conjunctival pallor or scleral icterus. Neck: Supple without lymphadenopathy, thyromegaly, JVD, or HJR. No carotid bruit. Lungs: Normal work of breathing. Clear to auscultation bilaterally without wheezes or crackles. Heart: Regular rate and rhythm without murmurs, rubs, or gallops. Non-displaced  PMI. Abd: Bowel sounds present. Soft, NT/ND without hepatosplenomegaly Ext: No lower extremity edema. Radial, PT, and DP pulses are 2+ bilaterally Skin: Warm and dry without rash. Neuro:  CNIII-XII intact. Strength and fine-touch sensation intact in upper and lower extremities bilaterally. Psych: Normal mood and affect.  EKG:  Normal sinus rhythm without abnormalities.  Lab Results  Component Value Date   WBC 7.7 09/15/2020   HGB 13.4 09/15/2020   HCT 37.7 09/15/2020   MCV 87.3 09/15/2020   PLT 246 09/15/2020    Lab Results  Component Value Date   NA 126 (L) 09/15/2020   K 2.9 (L) 09/15/2020   CL 91 (L) 09/15/2020   CO2 23 09/15/2020   BUN 20 09/15/2020   CREATININE 0.69 09/15/2020   GLUCOSE 95 09/15/2020   ALT 13 09/15/2020    --------------------------------------------------------------------------------------------------  ASSESSMENT AND PLAN: Shortness of breath and abnormal echocardiogram: [name] reports sporadic shortness of breath but otherwise has been asymptomatic.  Outside notes indicate moderate basal septal hypertrophy on recent echocardiogram though images and formal report are not available for review.  This would not be an unexpected finding in a 89 year old with history of hypertension.  We discussed symptomatic monitoring versus repeat echocardiogram in our office and have agreed to the latter.  No additional intervention recommended at this time.  Hypertension: Blood pressure moderately elevated today.  No outside blood pressure readings provided.  We will defer changes to her current antihypertensive regimen consisting of amlodipine today, though escalation will need to be considered at follow-up visit if blood pressure remains suboptimally controlled.  Continued blood pressure surveillance at [facility] also recommended.  Follow-up: Return to clinic after completion of echocardiogram.  Yvonne Kendall, MD 12/25/2022 10:12 AM

## 2022-12-26 ENCOUNTER — Encounter: Payer: Self-pay | Admitting: Internal Medicine

## 2022-12-26 DIAGNOSIS — R931 Abnormal findings on diagnostic imaging of heart and coronary circulation: Secondary | ICD-10-CM | POA: Insufficient documentation

## 2022-12-26 DIAGNOSIS — R0602 Shortness of breath: Secondary | ICD-10-CM | POA: Insufficient documentation

## 2022-12-26 DIAGNOSIS — I10 Essential (primary) hypertension: Secondary | ICD-10-CM | POA: Insufficient documentation

## 2022-12-27 ENCOUNTER — Ambulatory Visit: Payer: Medicare Other | Attending: Internal Medicine

## 2022-12-27 DIAGNOSIS — R0602 Shortness of breath: Secondary | ICD-10-CM | POA: Diagnosis not present

## 2022-12-28 LAB — ECHOCARDIOGRAM COMPLETE
AR max vel: 3.03 cm2
AV Area VTI: 2.84 cm2
AV Area mean vel: 2.84 cm2
AV Mean grad: 3 mmHg
AV Peak grad: 5.3 mmHg
AV Vena cont: 0.4 cm
Ao pk vel: 1.15 m/s
Area-P 1/2: 3.17 cm2
Calc EF: 58.9 %
S' Lateral: 2.2 cm
Single Plane A2C EF: 57.5 %
Single Plane A4C EF: 55.6 %

## 2023-01-17 ENCOUNTER — Ambulatory Visit: Payer: PRIVATE HEALTH INSURANCE | Admitting: Nurse Practitioner

## 2023-03-26 ENCOUNTER — Ambulatory Visit: Payer: Medicare Other | Admitting: Internal Medicine

## 2023-04-22 ENCOUNTER — Other Ambulatory Visit: Payer: Self-pay

## 2023-04-22 ENCOUNTER — Emergency Department: Payer: Medicare Other

## 2023-04-22 ENCOUNTER — Inpatient Hospital Stay
Admission: EM | Admit: 2023-04-22 | Discharge: 2023-04-25 | DRG: 309 | Disposition: A | Discharge status: Skilled Nursing Facility | Payer: Medicare Other | Source: Skilled Nursing Facility | Attending: Internal Medicine | Admitting: Internal Medicine

## 2023-04-22 DIAGNOSIS — M4316 Spondylolisthesis, lumbar region: Secondary | ICD-10-CM | POA: Diagnosis present

## 2023-04-22 DIAGNOSIS — M8088XA Other osteoporosis with current pathological fracture, vertebra(e), initial encounter for fracture: Secondary | ICD-10-CM | POA: Diagnosis present

## 2023-04-22 DIAGNOSIS — S22000A Wedge compression fracture of unspecified thoracic vertebra, initial encounter for closed fracture: Secondary | ICD-10-CM | POA: Insufficient documentation

## 2023-04-22 DIAGNOSIS — Z7983 Long term (current) use of bisphosphonates: Secondary | ICD-10-CM | POA: Diagnosis not present

## 2023-04-22 DIAGNOSIS — R1310 Dysphagia, unspecified: Secondary | ICD-10-CM | POA: Diagnosis present

## 2023-04-22 DIAGNOSIS — Z9842 Cataract extraction status, left eye: Secondary | ICD-10-CM | POA: Diagnosis not present

## 2023-04-22 DIAGNOSIS — S22000S Wedge compression fracture of unspecified thoracic vertebra, sequela: Secondary | ICD-10-CM | POA: Diagnosis not present

## 2023-04-22 DIAGNOSIS — Z602 Problems related to living alone: Secondary | ICD-10-CM | POA: Diagnosis present

## 2023-04-22 DIAGNOSIS — Z79899 Other long term (current) drug therapy: Secondary | ICD-10-CM

## 2023-04-22 DIAGNOSIS — W19XXXA Unspecified fall, initial encounter: Secondary | ICD-10-CM

## 2023-04-22 DIAGNOSIS — E7800 Pure hypercholesterolemia, unspecified: Secondary | ICD-10-CM | POA: Diagnosis present

## 2023-04-22 DIAGNOSIS — K219 Gastro-esophageal reflux disease without esophagitis: Secondary | ICD-10-CM | POA: Diagnosis present

## 2023-04-22 DIAGNOSIS — I959 Hypotension, unspecified: Secondary | ICD-10-CM | POA: Diagnosis not present

## 2023-04-22 DIAGNOSIS — Y92239 Unspecified place in hospital as the place of occurrence of the external cause: Secondary | ICD-10-CM | POA: Diagnosis present

## 2023-04-22 DIAGNOSIS — I4891 Unspecified atrial fibrillation: Principal | ICD-10-CM | POA: Diagnosis present

## 2023-04-22 DIAGNOSIS — Z7901 Long term (current) use of anticoagulants: Secondary | ICD-10-CM | POA: Diagnosis not present

## 2023-04-22 DIAGNOSIS — Z8249 Family history of ischemic heart disease and other diseases of the circulatory system: Secondary | ICD-10-CM

## 2023-04-22 DIAGNOSIS — Z66 Do not resuscitate: Secondary | ICD-10-CM | POA: Diagnosis present

## 2023-04-22 DIAGNOSIS — I480 Paroxysmal atrial fibrillation: Secondary | ICD-10-CM | POA: Diagnosis present

## 2023-04-22 DIAGNOSIS — R296 Repeated falls: Secondary | ICD-10-CM | POA: Diagnosis not present

## 2023-04-22 DIAGNOSIS — Z9841 Cataract extraction status, right eye: Secondary | ICD-10-CM

## 2023-04-22 DIAGNOSIS — I10 Essential (primary) hypertension: Secondary | ICD-10-CM | POA: Diagnosis present

## 2023-04-22 DIAGNOSIS — M810 Age-related osteoporosis without current pathological fracture: Secondary | ICD-10-CM | POA: Insufficient documentation

## 2023-04-22 DIAGNOSIS — I2489 Other forms of acute ischemic heart disease: Secondary | ICD-10-CM | POA: Diagnosis present

## 2023-04-22 DIAGNOSIS — Z880 Allergy status to penicillin: Secondary | ICD-10-CM | POA: Diagnosis not present

## 2023-04-22 DIAGNOSIS — Z888 Allergy status to other drugs, medicaments and biological substances status: Secondary | ICD-10-CM

## 2023-04-22 LAB — BASIC METABOLIC PANEL
Anion gap: 13 (ref 5–15)
BUN: 13 mg/dL (ref 8–23)
CO2: 21 mmol/L — ABNORMAL LOW (ref 22–32)
Calcium: 8.7 mg/dL — ABNORMAL LOW (ref 8.9–10.3)
Chloride: 102 mmol/L (ref 98–111)
Creatinine, Ser: 0.92 mg/dL (ref 0.44–1.00)
GFR, Estimated: 58 mL/min — ABNORMAL LOW (ref >60)
Glucose, Bld: 119 mg/dL — ABNORMAL HIGH (ref 70–99)
Potassium: 3.5 mmol/L (ref 3.5–5.1)
Sodium: 136 mmol/L (ref 135–145)

## 2023-04-22 LAB — CBC
HCT: 36.4 % (ref 36.0–46.0)
Hemoglobin: 12 g/dL (ref 12.0–15.0)
MCH: 29.6 pg (ref 26.0–34.0)
MCHC: 33 g/dL (ref 30.0–36.0)
MCV: 89.9 fL (ref 80.0–100.0)
Platelets: 236 10*3/uL (ref 150–400)
RBC: 4.05 MIL/uL (ref 3.87–5.11)
RDW: 14.6 % (ref 11.5–15.5)
WBC: 7 10*3/uL (ref 4.0–10.5)
nRBC: 0 % (ref 0.0–0.2)

## 2023-04-22 LAB — T4, FREE: Free T4: 1.27 ng/dL — ABNORMAL HIGH (ref 0.61–1.12)

## 2023-04-22 LAB — PROTIME-INR
INR: 1 (ref 0.8–1.2)
Prothrombin Time: 13.9 s (ref 11.4–15.2)

## 2023-04-22 LAB — URINALYSIS, ROUTINE W REFLEX MICROSCOPIC
Bilirubin Urine: NEGATIVE
Glucose, UA: NEGATIVE mg/dL
Hgb urine dipstick: NEGATIVE
Ketones, ur: 5 mg/dL — AB
Leukocytes,Ua: NEGATIVE
Nitrite: NEGATIVE
Protein, ur: NEGATIVE mg/dL
Specific Gravity, Urine: 1.006 (ref 1.005–1.030)
pH: 8 (ref 5.0–8.0)

## 2023-04-22 LAB — HEPARIN LEVEL (UNFRACTIONATED): Heparin Unfractionated: 0.57 [IU]/mL (ref 0.30–0.70)

## 2023-04-22 LAB — APTT: aPTT: 28 s (ref 24–36)

## 2023-04-22 LAB — CBG MONITORING, ED: Glucose-Capillary: 100 mg/dL — ABNORMAL HIGH (ref 70–99)

## 2023-04-22 LAB — TSH: TSH: 5.622 u[IU]/mL — ABNORMAL HIGH (ref 0.350–4.500)

## 2023-04-22 LAB — TROPONIN I (HIGH SENSITIVITY)
Troponin I (High Sensitivity): 15 ng/L (ref <18)
Troponin I (High Sensitivity): 305 ng/L (ref <18)
Troponin I (High Sensitivity): 906 ng/L (ref <18)

## 2023-04-22 LAB — MAGNESIUM: Magnesium: 1.9 mg/dL (ref 1.7–2.4)

## 2023-04-22 MED ORDER — SODIUM CHLORIDE 0.9 % IV SOLN: 250.0000 mL | INTRAVENOUS | Status: AC | PRN

## 2023-04-22 MED ORDER — SODIUM CHLORIDE 0.9% FLUSH: 3.0000 mL | INTRAVENOUS | Status: DC | PRN

## 2023-04-22 MED ORDER — SODIUM CHLORIDE 0.9% FLUSH
3.0000 mL | Freq: Two times a day (BID) | INTRAVENOUS | Status: DC
  Administered 2023-04-22 – 2023-04-24 (×6): 3 mL via INTRAVENOUS

## 2023-04-22 MED ORDER — HEPARIN BOLUS VIA INFUSION
2500.0000 [IU] | Freq: Once | INTRAVENOUS | Status: AC
  Administered 2023-04-22: 2500 [IU] via INTRAVENOUS
  Filled 2023-04-22: qty 2500

## 2023-04-22 MED ORDER — PANTOPRAZOLE SODIUM 40 MG PO TBEC
40.0000 mg | DELAYED_RELEASE_TABLET | Freq: Every day | ORAL | Status: DC
  Administered 2023-04-22 – 2023-04-25 (×4): 40 mg via ORAL
  Filled 2023-04-22 (×4): qty 1

## 2023-04-22 MED ORDER — DILTIAZEM LOAD VIA INFUSION
10.0000 mg | Freq: Once | INTRAVENOUS | Status: AC
  Administered 2023-04-22: 10 mg via INTRAVENOUS
  Filled 2023-04-22: qty 10

## 2023-04-22 MED ORDER — DILTIAZEM HCL-DEXTROSE 125-5 MG/125ML-% IV SOLN (PREMIX)
5.0000 mg/h | INTRAVENOUS | Status: DC
  Administered 2023-04-22: 10 mg/h via INTRAVENOUS
  Administered 2023-04-22: 20 mg/h via INTRAVENOUS
  Administered 2023-04-22: 5 mg/h via INTRAVENOUS
  Filled 2023-04-22 (×2): qty 125

## 2023-04-22 MED ORDER — RALOXIFENE HCL 60 MG PO TABS
60.0000 mg | ORAL_TABLET | Freq: Every day | ORAL | Status: DC
  Administered 2023-04-22: 60 mg via ORAL
  Filled 2023-04-22: qty 1

## 2023-04-22 MED ORDER — HEPARIN (PORCINE) 25000 UT/250ML-% IV SOLN
800.0000 [IU]/h | INTRAVENOUS | Status: DC
  Administered 2023-04-22: 800 [IU]/h via INTRAVENOUS
  Filled 2023-04-22: qty 250

## 2023-04-22 MED ORDER — ROSUVASTATIN CALCIUM 10 MG PO TABS
20.0000 mg | ORAL_TABLET | Freq: Every day | ORAL | Status: DC
  Administered 2023-04-23 – 2023-04-25 (×3): 20 mg via ORAL
  Filled 2023-04-22 (×3): qty 2

## 2023-04-22 NOTE — Consult Note (Signed)
PHARMACY - ANTICOAGULATION CONSULT NOTE  Pharmacy Consult for heparin infusion Indication: atrial fibrillation  Allergies  Allergen Reactions   Celebrex [Celecoxib]    Bactrim [Sulfamethoxazole-Trimethoprim]    Doxycycline    Penicillins     Patient Measurements: Height: 5' 2.5" (158.8 cm) Weight: 54 kg (119 lb 0.8 oz) IBW/kg (Calculated) : 51.25 Heparin Dosing Weight: 54 kg  Vital Signs: Temp: 98.3 F (36.8 C) (10/15 1929) Temp Source: Oral (10/15 1631) BP: 123/58 (10/15 1929) Pulse Rate: 81 (10/15 1929)  Labs: Recent Labs    04/22/23 0413 04/22/23 0638 04/22/23 1023 04/22/23 1919  HGB 12.0  --   --   --   HCT 36.4  --   --   --   PLT 236  --   --   --   APTT  --   --  28  --   LABPROT  --   --  13.9  --   INR  --   --  1.0  --   HEPARINUNFRC  --   --   --  0.57  CREATININE 0.92  --   --   --   TROPONINIHS 15 305* 906*  --     Estimated Creatinine Clearance: 31.6 mL/min (by C-G formula based on SCr of 0.92 mg/dL).   Medical History: Past Medical History:  Diagnosis Date   GERD (gastroesophageal reflux disease)    Hypertension    Osteoporosis     Medications:  No AC prior to admission.  Assessment: Patient admitted to ED with lightheadedness. PMH includes htn, osteoporosis, and compression fractures. Pharmacy consulted to manage heparin infusion for new onset Afib.  Baseline CBC appropriate to initiate heparin therapy. Baseline INR and aPTT also ordered, pending collection.  Date/Time  HL 10/15 1919  0.57  Goal of Therapy:  Heparin level 0.3-0.7 units/ml Monitor platelets by anticoagulation protocol: Yes   Plan:  Heparin level therapeutic x 1 Continue heparin infusion at 800 units/hr Check anti-Xa level in 8 hours Continue to monitor CBC daily  Paulita Fujita, PharmD Clinical Pharmacist 04/22/2023 7:50 PM

## 2023-04-22 NOTE — ED Notes (Signed)
Attending Wouk, notified about increased trending Troponin levels (15,305,906) and hr of 110 on this pt. MD stated he will let cardiology aware. No new Orders.

## 2023-04-22 NOTE — ED Notes (Signed)
Verbally notified EDP of critical troponin result from lab.

## 2023-04-22 NOTE — Consult Note (Signed)
Cardiology Consultation   Patient ID: Arlesia Kiel MRN: [id number]; DOB: 02-25-1929  Admit date: 04/22/2023 Date of Consult: 04/22/2023  PCP:  Ellan Lambert, NP   [facility] Providers Cardiologist:  Yvonne Kendall, MD   {   Patient Profile:   Ajayla Iglesias is a 89 y.o. female with a hx of osteoporosis, HTN, GERD, hypercholesterolemia, h/o falls who is being seen 04/22/2023 for the evaluation of new onset afib at the request of Dr. Ashok Pall.  History of Present Illness:   Ms. Wickard has a history of a fall in 2022. She was in [facility] [place] and was admitted for a fall. She fell and hit her head. CT scans were negative. She was found to have compression fracture of T1. Work-up was otherwise unremarkable. After this, she was brought to [facility] to be close to family.   The patient was referred to cardiology in 12/2022 for abnormal echo. Outside notes showed moderate basal septal hypertrophy, though formal reports were not available for review.  repeat echo showed LVEF 60-65%, no Wma, G1DD, normal RVSF, small pericardial effusion. The patient reported occasional SOB.   The patient presented to there ER with a fall. She is at an assisted living. She woke up to go to the bathroom and somehow fell, she does not remember the details. She remembers feeling dizzy and lightheaded while walking. Also remembers feeling some weakness in the days before the fall. She did not hit her head or lose conciousness. She denies chest pain, SOB or palpitations. She reports unchanged lower leg swelling.   In the ER she was noted to be in Afib RVR with rates into the 160s. BP 124/74, RR 22, afebrile, 100%O2. Labs showed Co1 21, BG 119, TSH 5.622, FT4 1.27. HS trop 15>305>906. X ray showed mid thoracic compression fracture. CXR non-acute. she was started on IV dilt and admitted.   Past Medical History:  Diagnosis Date   GERD (gastroesophageal reflux disease)    Hypertension    Osteoporosis     Past  Surgical History:  Procedure Laterality Date   CATARACT EXTRACTION Bilateral      Home Medications:  Prior to Admission medications   Medication Sig Start Date End Date Taking? Authorizing Provider  alendronate (FOSAMAX) 70 MG tablet Take 70 mg by mouth once a week.   Yes [provider]  amLODipine (NORVASC) 2.5 MG tablet Take 2.5 mg by mouth daily.   Yes [provider]  Biotin 2500 MCG CHEW Chew 1 tablet by mouth daily.   Yes [provider]  Calcium Citrate-Vitamin D (CITRACAL + D PO) Take 1 tablet by mouth daily.   Yes [provider]  Cholecalciferol (VITAMIN D3) 50 MCG (2000 UT) capsule Take 2,000 Units by mouth daily.   Yes [provider]  fluticasone (FLONASE) 50 MCG/ACT nasal spray Place 1 spray into both nostrils daily. 12/18/22  Yes [provider]  pantoprazole (PROTONIX) 20 MG tablet Take 20 mg by mouth daily. 04/02/23  Yes [provider]  raloxifene (EVISTA) 60 MG tablet Take 60 mg by mouth daily.   Yes [provider]  senna (SENOKOT) 8.6 MG tablet Take 1 tablet by mouth 2 (two) times daily. 09/19/20  Yes [provider]    Inpatient Medications: Scheduled Meds:  pantoprazole  40 mg Oral Daily   raloxifene  60 mg Oral Daily   sodium chloride flush  3 mL Intravenous Q12H   Continuous Infusions:  sodium chloride  diltiazem (CARDIZEM) infusion 20 mg/hr (04/22/23 1042)   heparin 800 Units/hr (04/22/23 1037)   PRN Meds: sodium chloride, sodium chloride flush  Allergies:    Allergies  Allergen Reactions   Celebrex [Celecoxib]    Bactrim [Sulfamethoxazole-Trimethoprim]    Doxycycline    Penicillins     Social History:   Social History   Socioeconomic History   Marital status: Widowed    Spouse name: Not on file   Number of children: Not on file   Years of education: Not on file   Highest education level: Not on file  Occupational History   Not on file  Tobacco Use    Smoking status: Never   Smokeless tobacco: Never  Vaping Use   Vaping status: Never Used  Substance and Sexual Activity   Alcohol use: Never   Drug use: Never   Sexual activity: Not on file  Other Topics Concern   Not on file  Social History Narrative   Not on file   Social Determinants of Health   Financial Resource Strain: Not on file  Food Insecurity: Patient Unable To Answer (04/22/2023)   Hunger Vital Sign    Worried About Running Out of Food in the Last Year: Patient unable to answer    Ran Out of Food in the Last Year: Patient unable to answer  Transportation Needs: Patient Unable To Answer (04/22/2023)   PRAPARE - Transportation    Lack of Transportation (Medical): Patient unable to answer    Lack of Transportation (Non-Medical): Patient unable to answer  Physical Activity: Not on file  Stress: Not on file  Social Connections: Not on file  Intimate Partner Violence: Patient Unable To Answer (04/22/2023)   Humiliation, Afraid, Rape, and Kick questionnaire    Fear of Current or Ex-Partner: Patient unable to answer    Emotionally Abused: Patient unable to answer    Physically Abused: Patient unable to answer    Sexually Abused: Patient unable to answer    Family History:    Family History  Problem Relation Age of Onset   Heart failure Mother 64   Multiple myeloma Father    Cancer Sister      ROS:  Please see the history of present illness.   All other ROS reviewed and negative.     Physical Exam/Data:   Vitals:   04/22/23 0700 04/22/23 0734 04/22/23 0800 04/22/23 0818  BP: 104/69 (!) 102/59 114/71   Pulse: (!) 117     Resp: (!) 35  (!) 34   Temp:    98.7 F (37.1 C)  TempSrc:    Oral  SpO2: 100% 100% 100%   Weight:      Height:        Intake/Output Summary (Last 24 hours) at 04/22/2023 1140 Last data filed at 04/22/2023 1133 Gross per 24 hour  Intake --  Output 450 ml  Net -450 ml      04/22/2023    4:33 AM 12/25/2022    9:43 AM 09/15/2020     9:44 AM  Last 3 Weights  Weight (lbs) 119 lb 0.8 oz 117 lb 105 lb  Weight (kg) 54 kg 53.071 kg 47.628 kg     Body mass index is 21.43 kg/m.  General:  Well nourished, well developed, in no acute distress HEENT: normal Neck: no JVD Vascular: No carotid bruits; Distal pulses 2+ bilaterally Cardiac:  normal S1, S2; Irreg IRreg; no murmur  Lungs:  clear to auscultation bilaterally, no wheezing, rhonchi or  rales  Abd: soft, nontender, no hepatomegaly  Ext: no edema Musculoskeletal:  No deformities, BUE and BLE strength normal and equal Skin: warm and dry  Neuro:  CNs 2-12 intact, no focal abnormalities noted Psych:  Normal affect   EKG:  The EKG was personally reviewed and demonstrates:  Afiv RVR 162bpm, rate related changes Telemetry:  Telemetry was personally reviewed and demonstrates:  Afib HR 110-130  Relevant CV Studies:  Echo 12/2022  1. Left ventricular ejection fraction, by estimation, is 60 to 65%. The  left ventricle has normal function. The left ventricle has no regional  wall motion abnormalities. Left ventricular diastolic parameters are  consistent with Grade I diastolic  dysfunction (impaired relaxation).   2. Right ventricular systolic function is normal. The right ventricular  size is normal. There is normal pulmonary artery systolic pressure. The  estimated right ventricular systolic pressure is 29.0 mmHg.   3. A small pericardial effusion is present. There is no evidence of  cardiac tamponade.   4. The mitral valve is normal in structure. Mild to moderate mitral valve  regurgitation. No evidence of mitral stenosis.   5. Tricuspid valve regurgitation is mild to moderate.   6. The aortic valve has an indeterminant number of cusps. Aortic valve  regurgitation is mild. No aortic stenosis is present.   7. There is borderline dilatation of the aortic root, measuring 37 mm.   8. The inferior vena cava is normal in size with <50% respiratory  variability,  suggesting right atrial pressure of 8 mmHg.    Laboratory Data:  High Sensitivity Troponin:   Recent Labs  Lab 04/22/23 0413 04/22/23 0638  TROPONINIHS 15 305*     Chemistry Recent Labs  Lab 04/22/23 0413  NA 136  K 3.5  CL 102  CO2 21*  GLUCOSE 119*  BUN 13  CREATININE 0.92  CALCIUM 8.7*  MG 1.9  GFRNONAA 58*  ANIONGAP 13    No results for input(s): "PROT", "ALBUMIN", "AST", "ALT", "ALKPHOS", "BILITOT" in the last 168 hours. Lipids No results for input(s): "CHOL", "TRIG", "HDL", "LABVLDL", "LDLCALC", "CHOLHDL" in the last 168 hours.  Hematology Recent Labs  Lab 04/22/23 0413  WBC 7.0  RBC 4.05  HGB 12.0  HCT 36.4  MCV 89.9  MCH 29.6  MCHC 33.0  RDW 14.6  PLT 236   Thyroid  Recent Labs  Lab 04/22/23 0413 04/22/23 0638  TSH 5.622*  --   FREET4  --  1.27*    BNPNo results for input(s): "BNP", "PROBNP" in the last 168 hours.  DDimer No results for input(s): "DDIMER" in the last 168 hours.   Radiology/Studies:  DG Hips Bilat W or Wo Pelvis 3-4 Views  Result Date: 04/22/2023 CLINICAL DATA:  Fall. EXAM: DG HIP (WITH OR WITHOUT PELVIS) 4V BILAT COMPARISON:  None Available. FINDINGS: No evidence of hip fracture or dislocation. No evidence of pelvic ring fracture or diastasis. 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 Subjective generalized osteopenia. IMPRESSION: No acute finding. Electronically Signed   By: Tiburcio Pea M.D.   On: 04/22/2023 06:48   DG Lumbar Spine Complete  Result Date: 04/22/2023 CLINICAL DATA:  Fall.  Back pain EXAM: LUMBAR SPINE - COMPLETE 4 VIEW COMPARISON:  None Available. FINDINGS: Transitional lumbosacral vertebra numbered L5 based on the lowest ribs. L2 compression fracture with superior endplate depression that is chronic appearing. Height loss is mild. Degeneration especially affecting facets with L4-5 anterolisthesis. Osteopenia and atherosclerosis. IMPRESSION: 1. Chronic L2 compression fracture. 2. Lumbar spine degeneration with  L4-5  anterolisthesis. Electronically Signed   By: Tiburcio Pea M.D.   On: 04/22/2023 06:47   DG Thoracic Spine 2 View  Result Date: 04/22/2023 CLINICAL DATA:  Fall with back pain EXAM: THORACIC SPINE 2 VIEWS COMPARISON:  09/15/2020 chest radiograph FINDINGS: Midthoracic vertebral body wedging which is new, likely T7 or T8. No indication of retropulsion or listhesis. Subjective generalized osteopenia. Maintained posterior mediastinal fat planes. IMPRESSION: Midthoracic compression fracture which is new from 2022 and age indeterminate. Height loss is mild. Electronically Signed   By: Tiburcio Pea M.D.   On: 04/22/2023 06:46   DG Chest Portable 1 View  Result Date: 04/22/2023 CLINICAL DATA:  Dizziness.  Fall. EXAM: PORTABLE CHEST 1 VIEW COMPARISON:  09/15/2020 FINDINGS: Lungs are hyperexpanded. The lungs are clear without focal pneumonia, edema, pneumothorax or pleural effusion. Interstitial markings are diffusely coarsened with chronic features. Skin fold noted over the right apex. The cardiopericardial silhouette is within normal limits for size. Telemetry leads overlie the chest. Bones are diffusely demineralized. IMPRESSION: Hyperexpansion with chronic interstitial coarsening. No acute cardiopulmonary findings. Electronically Signed   By: Kennith Center M.D.   On: 04/22/2023 06:08     Assessment and Plan:   New onset Afib - patient presented with a fall found to be in rapid afib started on IV dilt with improvement of heart rates - she reports she felt dizzy, lightheaded and weakness - no h/o afib, but has fallen in the past - echo in 12/2022 showed LVEF 60-65%, no WMA, G1DD, mild to mod MR, mild AI ,small pericardial effusion - CHADSVASC (age x2, female, HTN) at least 4. Continue IV heparin. She will require long-term a/c (with low dose), but need to consider fall risk.  - repeat echo has been ordered - continue with rate control with IV dilt - BP low a times - can consider TEE/cardioversion  if patient does not self-convert  Elevated troponin - troponin elevated in the setting of rapid afib - HS troponin trending up to 906 - no chest pain reported - IV heparin - no h/o CAD - check lipids and A1 - continue to trend troponin - would consider noninvasive ischemic testing  HTN - IV dilt - BP low at times  Abnormal thyroid function - elevated TSH and FT4 - per IM  Thoracic compression fx - per IM  For questions or updates, please contact [facility] Please consult [email or web address] for contact info under    Signed, Kenita Bines David Stall, PA-C  04/22/2023 11:40 AM

## 2023-04-22 NOTE — Plan of Care (Signed)
Problem: Elimination: Goal: Will not experience complications related to urinary retention Outcome: Progressing   Problem: Pain Managment: Goal: General experience of comfort will improve Outcome: Progressing

## 2023-04-22 NOTE — ED Provider Notes (Addendum)
[facility] Provider Note    Event Date/Time   First MD Initiated Contact with Patient 04/22/23 [phone]     (approximate)   History   Fall and Dizziness   HPI  Connie Schwartz is a 89 y.o. female with history of hypertension who presents to the emergency department after a fall from [facility].  Patient states she got up to go the bathroom and felt lightheaded and short of breath and slid down to the floor.  Is complaining of back and hip pain that has improved.  Did not hit her head or lose consciousness.  Not on blood thinners.  Was found to be in A-fib with RVR with EMS.  No known history of A-fib.  She denies any chest pain or chest discomfort.  No recent vomiting or diarrhea.   History provided by patient, EMS.    Past Medical History:  Diagnosis Date   GERD (gastroesophageal reflux disease)    Hypertension    Osteoporosis     Past Surgical History:  Procedure Laterality Date   CATARACT EXTRACTION Bilateral     MEDICATIONS:  Prior to Admission medications   Medication Sig Start Date End Date Taking? Authorizing Provider  alendronate (FOSAMAX) 70 MG tablet Take 70 mg by mouth once a week.    [provider]  amLODipine (NORVASC) 2.5 MG tablet Take 2.5 mg by mouth daily.    [provider]  Biotin 2500 MCG CHEW Chew 1 tablet by mouth daily.    [provider]  Calcium Citrate-Vitamin D (CITRACAL + D PO) Take 1 tablet by mouth daily.    [provider]  Cholecalciferol (VITAMIN D3) 50 MCG (2000 UT) capsule Take 2,000 Units by mouth daily.    [provider]  fluticasone (FLONASE) 50 MCG/ACT nasal spray Place 1 spray into both nostrils daily. 12/18/22   [provider]  pantoprazole (PROTONIX) 40 MG tablet Take 40 mg by mouth daily. 12/04/22   [provider]  raloxifene (EVISTA) 60 MG tablet Take 60 mg by mouth daily.    [provider]  senna (SENOKOT) 8.6 MG tablet Take 1 tablet  by mouth 2 (two) times daily. 09/19/20   [provider]    Physical Exam   Triage Vital Signs: ED Triage Vitals  Encounter Vitals Group     BP 04/22/23 0405 124/74     Systolic BP Percentile --      Diastolic BP Percentile --      Pulse Rate 04/22/23 0405 89     Resp 04/22/23 0405 (!) 22     Temp 04/22/23 0405 97.7 F (36.5 C)     Temp Source 04/22/23 0405 Oral     SpO2 04/22/23 0405 100 %     Weight 04/22/23 0433 119 lb 0.8 oz (54 kg)     Height 04/22/23 0433 5' 2.5" (1.588 m)     Head Circumference --      Peak Flow --      Pain Score 04/22/23 0409 0     Pain Loc --      Pain Education --      Exclude from Growth Chart --     Most recent vital signs: Vitals:   04/22/23 0630 04/22/23 0658  BP: 103/72   Pulse: (!) 40 (!) 125  Resp: (!) 43 (!) 28  Temp:    SpO2: 100% 100%     CONSTITUTIONAL: Alert, responds appropriately to questions.  Oriented x 4.  Well-appearing; well-nourished; GCS 15, elderly HEAD: Normocephalic; atraumatic EYES: Conjunctivae clear, PERRL, EOMI ENT: normal nose; no rhinorrhea; moist mucous membranes; pharynx without lesions noted; no dental injury; no septal hematoma, no epistaxis; no facial deformity or bony tenderness NECK: Supple, no midline spinal tenderness, step-off or deformity; trachea midline CARD: Irregularly irregular and tachycardic; S1 and S2 appreciated; no murmurs, no clicks, no rubs, no gallops RESP: Normal chest excursion without splinting or tachypnea; breath sounds clear and equal bilaterally; no wheezes, no rhonchi, no rales; no hypoxia or respiratory distress CHEST:  chest wall stable, no crepitus or ecchymosis or deformity, nontender to palpation; no flail chest ABD/GI: Non-distended; soft, non-tender, no rebound, no guarding; no ecchymosis or other lesions noted PELVIS:  stable, nontender to palpation BACK:  The back appears normal; no midline spinal tenderness, step-off or deformity EXT: Normal ROM in all joints;  no edema; normal capillary refill; no cyanosis, no bony tenderness or bony deformity of patient's extremities, no joint effusions, compartments are soft, extremities are warm and well-perfused, no ecchymosis, no calf tenderness or calf swelling SKIN: Normal color for age and race; warm NEURO: No facial asymmetry, normal speech, moving all extremities equally  ED Results / Procedures / Treatments   LABS: (all labs ordered are listed, but only abnormal results are displayed) Labs Reviewed  BASIC METABOLIC PANEL - Abnormal; Notable for the following components:      Result Value   CO2 21 (*)    Glucose, Bld 119 (*)    Calcium 8.7 (*)    GFR, Estimated 58 (*)    All other components within normal limits  TSH - Abnormal; Notable for the following components:   TSH 5.622 (*)    All other components within normal limits  CBG MONITORING, ED - Abnormal; Notable for the following components:   Glucose-Capillary 100 (*)    All other components within normal limits  CBC  MAGNESIUM  URINALYSIS, ROUTINE W REFLEX MICROSCOPIC  T4, FREE  TROPONIN I (HIGH SENSITIVITY)  TROPONIN I (HIGH SENSITIVITY)     EKG:  EKG Interpretation Date/Time:  Tuesday April 22 2023 04:06:52 EDT Ventricular Rate:  162 PR Interval:    QRS Duration:  90 QT Interval:  304 QTC Calculation: 500 R Axis:   72  Text Interpretation: Atrial fibrillation with rapid V-rate Repolarization abnormality, prob rate related Confirmed by Rochele Raring [phone]) on 04/22/2023 4:32:28 AM          RADIOLOGY: My personal review and interpretation of imaging: Chest x-ray clear.  Spinal and pelvic x-ray showed no traumatic injury.  I have personally reviewed all radiology reports. DG Hips Bilat W or Wo Pelvis 3-4 Views  Result Date: 04/22/2023 CLINICAL DATA:  Fall. EXAM: DG HIP (WITH OR WITHOUT PELVIS) 4V BILAT COMPARISON:  None Available. FINDINGS: No evidence of hip fracture or dislocation. No evidence of pelvic ring  fracture or diastasis. 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 Subjective generalized osteopenia. IMPRESSION: No acute finding. Electronically Signed   By: Tiburcio Pea M.D.   On: 04/22/2023 06:48   DG Lumbar Spine Complete  Result Date: 04/22/2023 CLINICAL DATA:  Fall.  Back pain EXAM: LUMBAR SPINE - COMPLETE 4 VIEW COMPARISON:  None Available. FINDINGS: Transitional lumbosacral vertebra numbered L5 based on the lowest ribs. L2 compression fracture with superior endplate depression that is chronic appearing. Height loss is mild. Degeneration especially affecting facets with L4-5 anterolisthesis. Osteopenia and atherosclerosis. IMPRESSION: 1. Chronic L2 compression fracture. 2. Lumbar spine degeneration with L4-5 anterolisthesis. Electronically Signed  By: Tiburcio Pea M.D.   On: 04/22/2023 06:47   DG Thoracic Spine 2 View  Result Date: 04/22/2023 CLINICAL DATA:  Fall with back pain EXAM: THORACIC SPINE 2 VIEWS COMPARISON:  09/15/2020 chest radiograph FINDINGS: Midthoracic vertebral body wedging which is new, likely T7 or T8. No indication of retropulsion or listhesis. Subjective generalized osteopenia. Maintained posterior mediastinal fat planes. IMPRESSION: Midthoracic compression fracture which is new from 2022 and age indeterminate. Height loss is mild. Electronically Signed   By: Tiburcio Pea M.D.   On: 04/22/2023 06:46   DG Chest Portable 1 View  Result Date: 04/22/2023 CLINICAL DATA:  Dizziness.  Fall. EXAM: PORTABLE CHEST 1 VIEW COMPARISON:  09/15/2020 FINDINGS: Lungs are hyperexpanded. The lungs are clear without focal pneumonia, edema, pneumothorax or pleural effusion. Interstitial markings are diffusely coarsened with chronic features. Skin fold noted over the right apex. The cardiopericardial silhouette is within normal limits for size. Telemetry leads overlie the chest. Bones are diffusely demineralized. IMPRESSION: Hyperexpansion with chronic interstitial coarsening. No acute  cardiopulmonary findings. Electronically Signed   By: Kennith Center M.D.   On: 04/22/2023 06:08     PROCEDURES:  Critical Care performed: Yes, see critical care procedure note(s)   CRITICAL CARE Performed by: Baxter Hire Angelina Neece   Total critical care time: 40 minutes  Critical care time was exclusive of separately billable procedures and treating other patients.  Critical care was necessary to treat or prevent imminent or life-threatening deterioration.  Critical care was time spent personally by me on the following activities: development of treatment plan with patient and/or surrogate as well as nursing, discussions with consultants, evaluation of patient's response to treatment, examination of patient, obtaining history from patient or surrogate, ordering and performing treatments and interventions, ordering and review of laboratory studies, ordering and review of radiographic studies, pulse oximetry and re-evaluation of patient's condition.   Marland Kitchen1-3 Lead EKG Interpretation  Performed by: Naftoli Penny, Layla Maw, DO Authorized by: Sheleen Conchas, Layla Maw, DO     Interpretation: abnormal     ECG rate:  160   ECG rate assessment: tachycardic     Rhythm: atrial fibrillation     Ectopy: none     Conduction: normal       IMPRESSION / MDM / ASSESSMENT AND PLAN / ED COURSE  I reviewed the triage vital signs and the nursing notes.  Patient here with a fall, shortness of breath and dizziness.  Found to be in new onset A-fib with RVR.  The patient is on the cardiac monitor to evaluate for evidence of arrhythmia and/or significant heart rate changes.   DIFFERENTIAL DIAGNOSIS (includes but not limited to):   A-fib with RVR, anemia, electrolyte derangement, thyroid dysfunction, ACS, less likely PE or dissection, CHF exacerbation.  Will obtain x-rays to rule out fractures of her spine, hips and pelvis.  Patient's presentation is most consistent with acute presentation with potential threat to life or bodily  function.  PLAN: Will obtain cardiac labs, thyroid function tests.  Will obtain x-rays of thoracic and lumbar spine, pelvis and bilateral hips although no tenderness or deformity on exam.  She declines anything for pain.  Will start her on diltiazem infusion and bolus.  Patient will need admission.   MEDICATIONS GIVEN IN ED: Medications  diltiazem (CARDIZEM) 1 mg/mL load via infusion 10 mg (10 mg Intravenous Bolus from Bag 04/22/23 0426)    And  diltiazem (CARDIZEM) 125 mg in dextrose 5% 125 mL (1 mg/mL) infusion (20 mg/hr Intravenous Rate/Dose Change 04/22/23  Eugénie.Cooks)     ED COURSE: Labs show normal hemoglobin, electrolytes.  Troponin negative.  TSH elevated.  Will add on free T4.  Chest x-ray, T and L-spine x-rays, hip x-rays reviewed and interpreted by myself and the radiologist and show no acute abnormality.  Old lumbar compression fracture seen.  New compression fracture in the thoracic vertebra new from 2022 but no tenderness over this area.  Low suspicion that this is acute today.  Will discuss with hospitalist for admission for A-fib with RVR on diltiazem infusion.   CONSULTS:  Consulted and discussed patient's case with hospitalist.  I have recommended admission and consulting physician agrees and will place admission orders.  Patient (and family if present) agree with this plan.   I reviewed all nursing notes, vitals, pertinent previous records.  All labs, EKGs, imaging ordered have been independently reviewed and interpreted by myself.    OUTSIDE RECORDS REVIEWED: Reviewed last internal medicine note in July 2022.       FINAL CLINICAL IMPRESSION(S) / ED DIAGNOSES   Final diagnoses:  Atrial fibrillation with RVR (HCC)  Fall, initial encounter     Rx / DC Orders   ED Discharge Orders     None        Note:  This document was prepared using Dragon voice recognition software and may include unintentional dictation errors.   Meilah Delrosario, Layla Maw, DO 04/22/23 0618     Elroy Schembri, Layla Maw, DO 04/22/23 [phone]

## 2023-04-22 NOTE — ED Notes (Signed)
Transported pt with CCM to xray

## 2023-04-22 NOTE — ED Notes (Signed)
ED Provider at bedside. 

## 2023-04-22 NOTE — H&P (Addendum)
History and Physical    Connie Schwartz UVO:[id number] DOB: Jan 18, 1929 DOA: 04/22/2023  PCP: Connie Lambert, NP  Patient coming from: home (ALF)   Chief Complaint: lightheadednes  HPI: Connie Schwartz is a 89 y.o. female with medical history significant for htn, osteoporosis, and compression fractures, presents with the above.  Lives alone in assisted living. No recent illness, was in usual state of health when went to bed last night, awoke and things felt off, went to use the restroom and felt ligheaded and fell to the floor with some mid-thoracic back pain. No cough or fever or recent med changes or history of a-fib or other heart problems.    Review of Systems: As per HPI otherwise 10 point review of systems negative.    Past Medical History:  Diagnosis Date   GERD (gastroesophageal reflux disease)    Hypertension    Osteoporosis     Past Surgical History:  Procedure Laterality Date   CATARACT EXTRACTION Bilateral      reports that she has never smoked. She has never used smokeless tobacco. She reports that she does not drink alcohol and does not use drugs.  Allergies  Allergen Reactions   Celebrex [Celecoxib]    Bactrim [Sulfamethoxazole-Trimethoprim]    Doxycycline    Penicillins     Family History  Problem Relation Age of Onset   Heart failure Mother 37   Multiple myeloma Father    Cancer Sister     Prior to Admission medications   Medication Sig Start Date End Date Taking? Authorizing Provider  alendronate (FOSAMAX) 70 MG tablet Take 70 mg by mouth once a week.    [provider]  amLODipine (NORVASC) 2.5 MG tablet Take 2.5 mg by mouth daily.    [provider]  Biotin 2500 MCG CHEW Chew 1 tablet by mouth daily.    [provider]  Calcium Citrate-Vitamin D (CITRACAL + D PO) Take 1 tablet by mouth daily.    [provider]  Cholecalciferol (VITAMIN D3) 50 MCG (2000 UT) capsule Take 2,000 Units by mouth daily.    [provider]  fluticasone (FLONASE) 50 MCG/ACT nasal spray Place 1 spray into both nostrils daily. 12/18/22   [provider]  pantoprazole (PROTONIX) 40 MG tablet Take 40 mg by mouth daily. 12/04/22   [provider]  raloxifene (EVISTA) 60 MG tablet Take 60 mg by mouth daily.    [provider]  senna (SENOKOT) 8.6 MG tablet Take 1 tablet by mouth 2 (two) times daily. 09/19/20   [provider]    Physical Exam: Vitals:   04/22/23 0700 04/22/23 0734 04/22/23 0800 04/22/23 0818  BP: 104/69 (!) 102/59 114/71   Pulse: (!) 117     Resp: (!) 35  (!) 34   Temp:    98.7 F (37.1 C)  TempSrc:    Oral  SpO2: 100% 100% 100%   Weight:      Height:        Constitutional: No acute distress Head: Atraumatic Eyes: Conjunctiva clear ENM: Moist mucous membranes. Normal dentition.  Neck: Supple Respiratory: Clear to auscultation bilaterally, no wheezing/rales/rhonchi. Normal respiratory effort. No accessory muscle use. . Cardiovascular: Regular rate and rhythm. No murmurs/rubs/gallops. Abdomen: Non-tender, non-distended. No masses. No rebound or guarding. Positive bowel sounds. Musculoskeletal: No joint deformity upper and lower extremities. Normal ROM, no contractures. Normal muscle tone.  Skin: No rashes, lesions, or ulcers.  Extremities: No peripheral edema. Palpable peripheral pulses. Neurologic: Alert, moving  all 4 extremities. Psychiatric: Normal insight and judgement.   Labs on Admission: I have personally reviewed following labs and imaging studies  CBC: Recent Labs  Lab 04/22/23 0413  WBC 7.0  HGB 12.0  HCT 36.4  MCV 89.9  PLT 236   Basic Metabolic Panel: Recent Labs  Lab 04/22/23 0413  NA 136  K 3.5  CL 102  CO2 21*  GLUCOSE 119*  BUN 13  CREATININE 0.92  CALCIUM 8.7*  MG 1.9   GFR: Estimated Creatinine Clearance: 31.6 mL/min (by C-G formula based on SCr of 0.92 mg/dL). Liver Function Tests: No results for input(s): "AST",  "ALT", "ALKPHOS", "BILITOT", "PROT", "ALBUMIN" in the last 168 hours. No results for input(s): "LIPASE", "AMYLASE" in the last 168 hours. No results for input(s): "AMMONIA" in the last 168 hours. Coagulation Profile: No results for input(s): "INR", "PROTIME" in the last 168 hours. Cardiac Enzymes: No results for input(s): "CKTOTAL", "CKMB", "CKMBINDEX", "TROPONINI" in the last 168 hours. BNP (last 3 results) No results for input(s): "PROBNP" in the last 8760 hours. HbA1C: No results for input(s): "HGBA1C" in the last 72 hours. CBG: Recent Labs  Lab 04/22/23 0424  GLUCAP 100*   Lipid Profile: No results for input(s): "CHOL", "HDL", "LDLCALC", "TRIG", "CHOLHDL", "LDLDIRECT" in the last 72 hours. Thyroid Function Tests: Recent Labs    04/22/23 0413 04/22/23 0638  TSH 5.622*  --   FREET4  --  1.27*   Anemia Panel: No results for input(s): "VITAMINB12", "FOLATE", "FERRITIN", "TIBC", "IRON", "RETICCTPCT" in the last 72 hours. Urine analysis:    Component Value Date/Time   COLORURINE STRAW (A) 04/22/2023 0819   APPEARANCEUR CLEAR (A) 04/22/2023 0819   LABSPEC 1.006 04/22/2023 0819   PHURINE 8.0 04/22/2023 0819   GLUCOSEU NEGATIVE 04/22/2023 0819   HGBUR NEGATIVE 04/22/2023 0819   BILIRUBINUR NEGATIVE 04/22/2023 0819   KETONESUR 5 (A) 04/22/2023 0819   PROTEINUR NEGATIVE 04/22/2023 0819   NITRITE NEGATIVE 04/22/2023 0819   LEUKOCYTESUR NEGATIVE 04/22/2023 0819    Radiological Exams on Admission: DG Hips Bilat W or Wo Pelvis 3-4 Views  Result Date: 04/22/2023 CLINICAL DATA:  Fall. EXAM: DG HIP (WITH OR WITHOUT PELVIS) 4V BILAT COMPARISON:  None Available. FINDINGS: No evidence of hip fracture or dislocation. No evidence of pelvic ring fracture or diastasis. 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 Subjective generalized osteopenia. IMPRESSION: No acute finding. Electronically Signed   By: Tiburcio Pea M.D.   On: 04/22/2023 06:48   DG Lumbar Spine Complete  Result Date:  04/22/2023 CLINICAL DATA:  Fall.  Back pain EXAM: LUMBAR SPINE - COMPLETE 4 VIEW COMPARISON:  None Available. FINDINGS: Transitional lumbosacral vertebra numbered L5 based on the lowest ribs. L2 compression fracture with superior endplate depression that is chronic appearing. Height loss is mild. Degeneration especially affecting facets with L4-5 anterolisthesis. Osteopenia and atherosclerosis. IMPRESSION: 1. Chronic L2 compression fracture. 2. Lumbar spine degeneration with L4-5 anterolisthesis. Electronically Signed   By: Tiburcio Pea M.D.   On: 04/22/2023 06:47   DG Thoracic Spine 2 View  Result Date: 04/22/2023 CLINICAL DATA:  Fall with back pain EXAM: THORACIC SPINE 2 VIEWS COMPARISON:  09/15/2020 chest radiograph FINDINGS: Midthoracic vertebral body wedging which is new, likely T7 or T8. No indication of retropulsion or listhesis. Subjective generalized osteopenia. Maintained posterior mediastinal fat planes. IMPRESSION: Midthoracic compression fracture which is new from 2022 and age indeterminate. Height loss is mild. Electronically Signed   By: Tiburcio Pea M.D.   On: 04/22/2023 06:46   DG Chest  Portable 1 View  Result Date: 04/22/2023 CLINICAL DATA:  Dizziness.  Fall. EXAM: PORTABLE CHEST 1 VIEW COMPARISON:  09/15/2020 FINDINGS: Lungs are hyperexpanded. The lungs are clear without focal pneumonia, edema, pneumothorax or pleural effusion. Interstitial markings are diffusely coarsened with chronic features. Skin fold noted over the right apex. The cardiopericardial silhouette is within normal limits for size. Telemetry leads overlie the chest. Bones are diffusely demineralized. IMPRESSION: Hyperexpansion with chronic interstitial coarsening. No acute cardiopulmonary findings. Electronically Signed   By: Kennith Center M.D.   On: 04/22/2023 06:08    EKG: Independently reviewed. A-fib with rvr  Assessment/Plan Active Problems:   Essential hypertension   Atrial fibrillation with rapid  ventricular response (HCC)   Osteoporosis   Thoracic compression fracture (HCC)   # A-fib with rvr No hx of. Started on dilt gtt in ED and now up to 20 but still with rvr. Chads2vasc is 4. No clear precipitator.  - continue dilt for now - cardiology consulted - update TTE - start heparin  # Elevated troponin Normal on admission, now 305, suspect demand from rvr, no chest pain - continue to trend trop - echo as above - cardiology consult as above  # Elevated t4 With elevated tsh - f/u t3  # HTN Here bp low normal on dilt gtt - holding home amlodipine  # Osteoporosis is on weekly alendronate at home, rph advises holding home evista  # Thoracic T8 or 8 compression fracture New, no signs retropulsion or listhesis, possibly consequence of fall, no sig back pain or tenderness currently - monitor  DVT prophylaxis: iv heparin Code Status: dnr/dni, confirmed with patient  Family Communication: niece kristin updated telephonically 10/15 Consults called: cardiology  Level of care: progressive    Silvano Bilis MD [facility] Pager [phone]  If 7PM-7AM, please contact night-coverage [email or web address] Password [email or web address]  04/22/2023, 9:03 AM

## 2023-04-22 NOTE — ED Notes (Addendum)
Wouk, MD, and Fransico Michael, PA-C, made aware of diltiazem infusing at 20 mg/hr with BP of 97/73 (82) and HR 120. Per Fransico Michael, PA-C, diltiazem to remain infusing at 20 mg/hr unless BP drops lower. If BP drops, dilt to be titrated back to 15 mg/hr.

## 2023-04-22 NOTE — Consult Note (Signed)
PHARMACY - ANTICOAGULATION CONSULT NOTE  Pharmacy Consult for heparin infusion Indication: atrial fibrillation  Allergies  Allergen Reactions   Celebrex [Celecoxib]    Bactrim [Sulfamethoxazole-Trimethoprim]    Doxycycline    Penicillins     Patient Measurements: Height: 5' 2.5" (158.8 cm) Weight: 54 kg (119 lb 0.8 oz) IBW/kg (Calculated) : 51.25 Heparin Dosing Weight: 54 kg  Vital Signs: Temp: 98.7 F (37.1 C) (10/15 0818) Temp Source: Oral (10/15 0818) BP: 114/71 (10/15 0800) Pulse Rate: 117 (10/15 0700)  Labs: Recent Labs    04/22/23 0413 04/22/23 0638  HGB 12.0  --   HCT 36.4  --   PLT 236  --   CREATININE 0.92  --   TROPONINIHS 15 305*    Estimated Creatinine Clearance: 31.6 mL/min (by C-G formula based on SCr of 0.92 mg/dL).   Medical History: Past Medical History:  Diagnosis Date   GERD (gastroesophageal reflux disease)    Hypertension    Osteoporosis     Medications:  No AC prior to admission.  Assessment: Patient admitted to ED with lightheadedness. PMH includes htn, osteoporosis, and compression fractures. Pharmacy consulted to manage heparin infusion for new onset Afib.  Baseline CBC appropriate to initiate heparin therapy. Baseline INR and aPTT also ordered, pending collection.  Goal of Therapy:  Heparin level 0.3-0.7 units/ml Monitor platelets by anticoagulation protocol: Yes   Plan:  Give 2500 units bolus x 1 Start heparin infusion at 800 units/hr Check anti-Xa level in 8 hours and daily while on heparin Continue to monitor H&H and platelets  Ashon Rosenberg Rodriguez-Guzman PharmD, BCPS 04/22/2023 9:39 AM

## 2023-04-22 NOTE — ED Triage Notes (Signed)
Pt BIB EMS from [facility] s/p fall. Pt states she felt dizzy and tried to walk to the bathroom and fell.Pt c/o of back pain above the waist. Pt was able to get up by self and get back to bed. Pt was found to be in AFIB w/ RVR ranging from 160-220BPM. No reported hx of afib. No LOC and not on blood thinners. No CP, reports some SHOB.

## 2023-04-23 ENCOUNTER — Other Ambulatory Visit (HOSPITAL_COMMUNITY): Payer: Self-pay

## 2023-04-23 ENCOUNTER — Inpatient Hospital Stay (HOSPITAL_COMMUNITY)
Admit: 2023-04-23 | Discharge: 2023-04-23 | Disposition: A | Discharge status: Home / Self Care | Payer: Medicare Other | Attending: Cardiovascular Disease | Admitting: Cardiovascular Disease

## 2023-04-23 DIAGNOSIS — R296 Repeated falls: Secondary | ICD-10-CM | POA: Diagnosis not present

## 2023-04-23 DIAGNOSIS — I10 Essential (primary) hypertension: Secondary | ICD-10-CM | POA: Diagnosis not present

## 2023-04-23 DIAGNOSIS — I2489 Other forms of acute ischemic heart disease: Secondary | ICD-10-CM | POA: Diagnosis not present

## 2023-04-23 DIAGNOSIS — W19XXXA Unspecified fall, initial encounter: Secondary | ICD-10-CM

## 2023-04-23 DIAGNOSIS — I4891 Unspecified atrial fibrillation: Secondary | ICD-10-CM | POA: Diagnosis not present

## 2023-04-23 LAB — LIPID PANEL
Cholesterol: 211 mg/dL — ABNORMAL HIGH (ref 0–200)
HDL: 83 mg/dL (ref >40)
LDL Cholesterol: 118 mg/dL — ABNORMAL HIGH (ref 0–99)
Total CHOL/HDL Ratio: 2.5 {ratio}
Triglycerides: 48 mg/dL (ref <150)
VLDL: 10 mg/dL (ref 0–40)

## 2023-04-23 LAB — BASIC METABOLIC PANEL
Anion gap: 7 (ref 5–15)
BUN: 14 mg/dL (ref 8–23)
CO2: 24 mmol/L (ref 22–32)
Calcium: 8.2 mg/dL — ABNORMAL LOW (ref 8.9–10.3)
Chloride: 103 mmol/L (ref 98–111)
Creatinine, Ser: 0.79 mg/dL (ref 0.44–1.00)
GFR, Estimated: >60 mL/min (ref >60)
Glucose, Bld: 105 mg/dL — ABNORMAL HIGH (ref 70–99)
Potassium: 3.6 mmol/L (ref 3.5–5.1)
Sodium: 134 mmol/L — ABNORMAL LOW (ref 135–145)

## 2023-04-23 LAB — ECHOCARDIOGRAM COMPLETE
AR max vel: 2.7 cm2
AV Area VTI: 2.88 cm2
AV Area mean vel: 2.38 cm2
AV Mean grad: 3 mm[Hg]
AV Peak grad: 6 mm[Hg]
Ao pk vel: 1.22 m/s
Area-P 1/2: 3.1 cm2
Height: 62.5 in
MV VTI: 3.66 cm2
S' Lateral: 2.4 cm
Weight: 1904.77 [oz_av]

## 2023-04-23 LAB — PHOSPHORUS: Phosphorus: 3.5 mg/dL (ref 2.5–4.6)

## 2023-04-23 LAB — CBC
HCT: 34.8 % — ABNORMAL LOW (ref 36.0–46.0)
Hemoglobin: 11.7 g/dL — ABNORMAL LOW (ref 12.0–15.0)
MCH: 29.4 pg (ref 26.0–34.0)
MCHC: 33.6 g/dL (ref 30.0–36.0)
MCV: 87.4 fL (ref 80.0–100.0)
Platelets: 237 10*3/uL (ref 150–400)
RBC: 3.98 MIL/uL (ref 3.87–5.11)
RDW: 14.6 % (ref 11.5–15.5)
WBC: 8 10*3/uL (ref 4.0–10.5)
nRBC: 0 % (ref 0.0–0.2)

## 2023-04-23 LAB — HEPARIN LEVEL (UNFRACTIONATED): Heparin Unfractionated: 0.61 [IU]/mL (ref 0.30–0.70)

## 2023-04-23 LAB — MAGNESIUM: Magnesium: 2 mg/dL (ref 1.7–2.4)

## 2023-04-23 MED ORDER — APIXABAN 2.5 MG PO TABS
2.5000 mg | ORAL_TABLET | Freq: Two times a day (BID) | ORAL | Status: DC
  Administered 2023-04-23 – 2023-04-25 (×5): 2.5 mg via ORAL
  Filled 2023-04-23 (×5): qty 1

## 2023-04-23 MED ORDER — DILTIAZEM HCL ER COATED BEADS 120 MG PO CP24
120.0000 mg | ORAL_CAPSULE | Freq: Every day | ORAL | Status: DC
  Administered 2023-04-23 – 2023-04-25 (×3): 120 mg via ORAL
  Filled 2023-04-23 (×3): qty 1

## 2023-04-23 MED ORDER — MORPHINE SULFATE (PF) 2 MG/ML IV SOLN: 2.0000 mg | INTRAVENOUS | Status: DC | PRN

## 2023-04-23 MED ORDER — POTASSIUM CHLORIDE CRYS ER 20 MEQ PO TBCR
40.0000 meq | EXTENDED_RELEASE_TABLET | Freq: Once | ORAL | Status: AC
  Administered 2023-04-23: 40 meq via ORAL
  Filled 2023-04-23: qty 2

## 2023-04-23 MED ORDER — GLUCERNA SHAKE PO LIQD
237.0000 mL | Freq: Three times a day (TID) | ORAL | Status: DC
  Administered 2023-04-23 – 2023-04-24 (×4): 237 mL via ORAL

## 2023-04-23 MED ORDER — ACETAMINOPHEN 325 MG PO TABS
650.0000 mg | ORAL_TABLET | Freq: Four times a day (QID) | ORAL | Status: DC | PRN
  Administered 2023-04-23 – 2023-04-24 (×3): 650 mg via ORAL
  Filled 2023-04-23 (×5): qty 2

## 2023-04-23 NOTE — Plan of Care (Signed)

## 2023-04-23 NOTE — Consult Note (Signed)
PHARMACY - ANTICOAGULATION CONSULT NOTE  Pharmacy Consult for heparin infusion Indication: atrial fibrillation  Allergies  Allergen Reactions   Celebrex [Celecoxib]    Bactrim [Sulfamethoxazole-Trimethoprim]    Doxycycline    Penicillins     Patient Measurements: Height: 5' 2.5" (158.8 cm) Weight: 54 kg (119 lb 0.8 oz) IBW/kg (Calculated) : 51.25 Heparin Dosing Weight: 54 kg  Vital Signs: Temp: 99.7 F (37.6 C) (10/15 2331) BP: 118/59 (10/15 2331) Pulse Rate: 80 (10/15 2331)  Labs: Recent Labs    04/22/23 0413 04/22/23 0638 04/22/23 1023 04/22/23 1919 04/23/23 0515  HGB 12.0  --   --   --  11.7*  HCT 36.4  --   --   --  34.8*  PLT 236  --   --   --  237  APTT  --   --  28  --   --   LABPROT  --   --  13.9  --   --   INR  --   --  1.0  --   --   HEPARINUNFRC  --   --   --  0.57 0.61  CREATININE 0.92  --   --   --  0.79  TROPONINIHS 15 305* 906*  --   --     Estimated Creatinine Clearance: 36.3 mL/min (by C-G formula based on SCr of 0.79 mg/dL).   Medical History: Past Medical History:  Diagnosis Date   GERD (gastroesophageal reflux disease)    Hypertension    Osteoporosis     Medications:  No AC prior to admission.  Assessment: Patient admitted to ED with lightheadedness. PMH includes htn, osteoporosis, and compression fractures. Pharmacy consulted to manage heparin infusion for new onset Afib.  Baseline CBC appropriate to initiate heparin therapy. Baseline INR and aPTT also ordered, pending collection.  Date/Time  HL 10/15 1919  0.57 10/16 0515  0.61, therapeutic x 2  Goal of Therapy:  Heparin level 0.3-0.7 units/ml Monitor platelets by anticoagulation protocol: Yes   Plan:  Continue heparin infusion at 800 units/hr Check anti-Xa level daily w/ AM labs while therapeutic Continue to monitor CBC daily  Otelia Sergeant, PharmD, [facility] 04/23/2023 6:33 AM

## 2023-04-23 NOTE — Progress Notes (Signed)
*  PRELIMINARY RESULTS* Echocardiogram 2D Echocardiogram has been performed.  Carolyne Fiscal 04/23/2023, 4:05 PM

## 2023-04-23 NOTE — Progress Notes (Signed)
Rounding Note    Patient Name: Rosangelica Pevehouse Date of Encounter: 04/23/2023  [facility] Cardiologist: Yvonne Kendall, MD   Subjective   Patient is back in NSR. She is overall feeling OK. No chest pain, SOB, palpitations.   Inpatient Medications    Scheduled Meds:  pantoprazole  40 mg Oral Daily   potassium chloride  40 mEq Oral Once   rosuvastatin  20 mg Oral Daily   sodium chloride flush  3 mL Intravenous Q12H   Continuous Infusions:  sodium chloride     diltiazem (CARDIZEM) infusion 5 mg/hr (04/23/23 0400)   heparin 800 Units/hr (04/23/23 0400)   PRN Meds: sodium chloride, sodium chloride flush   Vital Signs    Vitals:   04/23/23 0400 04/23/23 0500 04/23/23 0600 04/23/23 0700  BP: (!) 104/51 114/63 (!) 121/55   Pulse: 67 74 65   Resp: (!) 23 17 20    Temp:    98.5 F (36.9 C)  TempSrc:    Oral  SpO2: 94% 97% 94%   Weight:      Height:        Intake/Output Summary (Last 24 hours) at 04/23/2023 0812 Last data filed at 04/23/2023 0400 Gross per 24 hour  Intake 386.74 ml  Output 450 ml  Net -63.26 ml      04/22/2023    4:33 AM 12/25/2022    9:43 AM 09/15/2020    9:44 AM  Last 3 Weights  Weight (lbs) 119 lb 0.8 oz 117 lb 105 lb  Weight (kg) 54 kg 53.071 kg 47.628 kg      Telemetry    NSR HR 70s- Personally Reviewed  ECG    No new - Personally Reviewed  Physical Exam   GEN: No acute distress.   Neck: No JVD Cardiac: RRR, no murmurs, rubs, or gallops.  Respiratory: Clear to auscultation bilaterally. GI: Soft, nontender, non-distended  MS: No edema; No deformity. Neuro:  Nonfocal  Psych: Normal affect   Labs    High Sensitivity Troponin:   Recent Labs  Lab 04/22/23 0413 04/22/23 0638 04/22/23 1023  TROPONINIHS 15 305* 906*     Chemistry Recent Labs  Lab 04/22/23 0413 04/23/23 0515  NA 136 134*  K 3.5 3.6  CL 102 103  CO2 21* 24  GLUCOSE 119* 105*  BUN 13 14  CREATININE 0.92 0.79  CALCIUM 8.7* 8.2*  MG 1.9  --    GFRNONAA 58* >60  ANIONGAP 13 7    Lipids No results for input(s): "CHOL", "TRIG", "HDL", "LABVLDL", "LDLCALC", "CHOLHDL" in the last 168 hours.  Hematology Recent Labs  Lab 04/22/23 0413 04/23/23 0515  WBC 7.0 8.0  RBC 4.05 3.98  HGB 12.0 11.7*  HCT 36.4 34.8*  MCV 89.9 87.4  MCH 29.6 29.4  MCHC 33.0 33.6  RDW 14.6 14.6  PLT 236 237   Thyroid  Recent Labs  Lab 04/22/23 0413 04/22/23 0638  TSH 5.622*  --   FREET4  --  1.27*    BNPNo results for input(s): "BNP", "PROBNP" in the last 168 hours.  DDimer No results for input(s): "DDIMER" in the last 168 hours.   Radiology    DG Hips Bilat W or Wo Pelvis 3-4 Views  Result Date: 04/22/2023 CLINICAL DATA:  Fall. EXAM: DG HIP (WITH OR WITHOUT PELVIS) 4V BILAT COMPARISON:  None Available. FINDINGS: No evidence of hip fracture or dislocation. No evidence of pelvic ring fracture or diastasis. 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 Subjective generalized osteopenia. IMPRESSION:  No acute finding. Electronically Signed   By: Tiburcio Pea M.D.   On: 04/22/2023 06:48   DG Lumbar Spine Complete  Result Date: 04/22/2023 CLINICAL DATA:  Fall.  Back pain EXAM: LUMBAR SPINE - COMPLETE 4 VIEW COMPARISON:  None Available. FINDINGS: Transitional lumbosacral vertebra numbered L5 based on the lowest ribs. L2 compression fracture with superior endplate depression that is chronic appearing. Height loss is mild. Degeneration especially affecting facets with L4-5 anterolisthesis. Osteopenia and atherosclerosis. IMPRESSION: 1. Chronic L2 compression fracture. 2. Lumbar spine degeneration with L4-5 anterolisthesis. Electronically Signed   By: Tiburcio Pea M.D.   On: 04/22/2023 06:47   DG Thoracic Spine 2 View  Result Date: 04/22/2023 CLINICAL DATA:  Fall with back pain EXAM: THORACIC SPINE 2 VIEWS COMPARISON:  09/15/2020 chest radiograph FINDINGS: Midthoracic vertebral body wedging which is new, likely T7 or T8. No indication of retropulsion or  listhesis. Subjective generalized osteopenia. Maintained posterior mediastinal fat planes. IMPRESSION: Midthoracic compression fracture which is new from 2022 and age indeterminate. Height loss is mild. Electronically Signed   By: Tiburcio Pea M.D.   On: 04/22/2023 06:46   DG Chest Portable 1 View  Result Date: 04/22/2023 CLINICAL DATA:  Dizziness.  Fall. EXAM: PORTABLE CHEST 1 VIEW COMPARISON:  09/15/2020 FINDINGS: Lungs are hyperexpanded. The lungs are clear without focal pneumonia, edema, pneumothorax or pleural effusion. Interstitial markings are diffusely coarsened with chronic features. Skin fold noted over the right apex. The cardiopericardial silhouette is within normal limits for size. Telemetry leads overlie the chest. Bones are diffusely demineralized. IMPRESSION: Hyperexpansion with chronic interstitial coarsening. No acute cardiopulmonary findings. Electronically Signed   By: Kennith Center M.D.   On: 04/22/2023 06:08    Cardiac Studies   Expand All Collapse All      Cardiology Consultation    Patient ID: Raeden Belzer MRN: [id number]; DOB: May 07, 1929   Admit date: 04/22/2023 Date of Consult: 04/22/2023   PCP:  Ellan Lambert, NP              [facility] Providers Cardiologist:  Yvonne Kendall, MD   {     Patient Profile:    Amea Mcphail is a 89 y.o. female with a hx of osteoporosis, HTN, GERD, hypercholesterolemia, h/o falls who is being seen 04/22/2023 for the evaluation of new onset afib at the request of Dr. Ashok Pall.   History of Present Illness:    Ms. Tutton has a history of a fall in 2022. She was in [facility] [place] and was admitted for a fall. She fell and hit her head. CT scans were negative. She was found to have compression fracture of T1. Work-up was otherwise unremarkable. After this, she was brought to [facility] to be close to family.    The patient was referred to cardiology in 12/2022 for abnormal echo. Outside notes showed moderate basal septal  hypertrophy, though formal reports were not available for review.  repeat echo showed LVEF 60-65%, no Wma, G1DD, normal RVSF, small pericardial effusion. The patient reported occasional SOB.    The patient presented to there ER with a fall. She is at an assisted living. She woke up to go to the bathroom and somehow fell, she does not remember the details. She remembers feeling dizzy and lightheaded while walking. Also remembers feeling some weakness in the days before the fall. She did not hit her head or lose conciousness. She denies chest pain, SOB or palpitations. She reports unchanged lower leg swelling.    In  the ER she was noted to be in Afib RVR with rates into the 160s. BP 124/74, RR 22, afebrile, 100%O2. Labs showed Co1 21, BG 119, TSH 5.622, FT4 1.27. HS trop 15>305>906. X ray showed mid thoracic compression fracture. CXR non-acute. she was started on IV dilt and admitted.        Past Medical History:  Diagnosis Date   GERD (gastroesophageal reflux disease)     Hypertension     Osteoporosis                 Past Surgical History:  Procedure Laterality Date   CATARACT EXTRACTION Bilateral            Home Medications:         Prior to Admission medications   Medication Sig Start Date End Date Taking? Authorizing Provider  alendronate (FOSAMAX) 70 MG tablet Take 70 mg by mouth once a week.     Yes [provider]  amLODipine (NORVASC) 2.5 MG tablet Take 2.5 mg by mouth daily.     Yes [provider]  Biotin 2500 MCG CHEW Chew 1 tablet by mouth daily.     Yes [provider]  Calcium Citrate-Vitamin D (CITRACAL + D PO) Take 1 tablet by mouth daily.     Yes [provider]  Cholecalciferol (VITAMIN D3) 50 MCG (2000 UT) capsule Take 2,000 Units by mouth daily.     Yes [provider]  fluticasone (FLONASE) 50 MCG/ACT nasal spray Place 1 spray into both nostrils daily. 12/18/22   Yes [provider]  pantoprazole (PROTONIX) 20  MG tablet Take 20 mg by mouth daily. 04/02/23   Yes [provider]  raloxifene (EVISTA) 60 MG tablet Take 60 mg by mouth daily.     Yes [provider]  senna (SENOKOT) 8.6 MG tablet Take 1 tablet by mouth 2 (two) times daily. 09/19/20   Yes [provider]      Inpatient Medications: Scheduled Meds:  pantoprazole  40 mg Oral Daily   raloxifene  60 mg Oral Daily   sodium chloride flush  3 mL Intravenous Q12H        Continuous Infusions:  sodium chloride     diltiazem (CARDIZEM) infusion 20 mg/hr (04/22/23 1042)   heparin 800 Units/hr (04/22/23 1037)        PRN Meds: sodium chloride, sodium chloride flush       Allergies:    Allergies      Allergies  Allergen Reactions   Celebrex [Celecoxib]     Bactrim [Sulfamethoxazole-Trimethoprim]     Doxycycline     Penicillins          Social History:   Social History         Socioeconomic History   Marital status: Widowed      Spouse name: Not on file   Number of children: Not on file   Years of education: Not on file   Highest education level: Not on file  Occupational History   Not on file  Tobacco Use   Smoking status: Never   Smokeless tobacco: Never  Vaping Use   Vaping status: Never Used  Substance and Sexual Activity   Alcohol use: Never   Drug use: Never   Sexual activity: Not on file  Other Topics Concern   Not on file  Social History Narrative   Not on file    Social Determinants of Health        Financial  Resource Strain: Not on file  Food Insecurity: Patient Unable To Answer (04/22/2023)    Hunger Vital Sign     Worried About Running Out of Food in the Last Year: Patient unable to answer     Ran Out of Food in the Last Year: Patient unable to answer  Transportation Needs: Patient Unable To Answer (04/22/2023)    PRAPARE - Transportation     Lack of Transportation (Medical): Patient unable to answer     Lack of Transportation (Non-Medical): Patient unable to answer   Physical Activity: Not on file  Stress: Not on file  Social Connections: Not on file  Intimate Partner Violence: Patient Unable To Answer (04/22/2023)    Humiliation, Afraid, Rape, and Kick questionnaire     Fear of Current or Ex-Partner: Patient unable to answer     Emotionally Abused: Patient unable to answer     Physically Abused: Patient unable to answer     Sexually Abused: Patient unable to answer    Family History:          Family History  Problem Relation Age of Onset   Heart failure Mother 50   Multiple myeloma Father     Cancer Sister            ROS:  Please see the history of present illness.    All other ROS reviewed and negative.      Physical Exam/Data:          Vitals:    04/22/23 0700 04/22/23 0734 04/22/23 0800 04/22/23 0818  BP: 104/69 (!) 102/59 114/71    Pulse: (!) 117        Resp: (!) 35   (!) 34    Temp:       98.7 F (37.1 C)  TempSrc:       Oral  SpO2: 100% 100% 100%    Weight:          Height:              Intake/Output Summary (Last 24 hours) at 04/22/2023 1140 Last data filed at 04/22/2023 1133    Gross per 24 hour  Intake --  Output 450 ml  Net -450 ml        04/22/2023    4:33 AM 12/25/2022    9:43 AM 09/15/2020    9:44 AM  Last 3 Weights  Weight (lbs) 119 lb 0.8 oz 117 lb 105 lb  Weight (kg) 54 kg 53.071 kg 47.628 kg     Body mass index is 21.43 kg/m.  General:  Well nourished, well developed, in no acute distress HEENT: normal Neck: no JVD Vascular: No carotid bruits; Distal pulses 2+ bilaterally Cardiac:  normal S1, S2; Irreg IRreg; no murmur  Lungs:  clear to auscultation bilaterally, no wheezing, rhonchi or rales  Abd: soft, nontender, no hepatomegaly  Ext: no edema Musculoskeletal:  No deformities, BUE and BLE strength normal and equal Skin: warm and dry  Neuro:  CNs 2-12 intact, no focal abnormalities noted Psych:  Normal affect    EKG:  The EKG was personally reviewed and demonstrates:  Afiv RVR 162bpm,  rate related changes Telemetry:  Telemetry was personally reviewed and demonstrates:  Afib HR 110-130   Relevant CV Studies:   Echo 12/2022  1. Left ventricular ejection fraction, by estimation, is 60 to 65%. The  left ventricle has normal function. The left ventricle has no regional  wall motion abnormalities. Left ventricular diastolic parameters are  consistent with  Grade I diastolic  dysfunction (impaired relaxation).   2. Right ventricular systolic function is normal. The right ventricular  size is normal. There is normal pulmonary artery systolic pressure. The  estimated right ventricular systolic pressure is 29.0 mmHg.   3. A small pericardial effusion is present. There is no evidence of  cardiac tamponade.   4. The mitral valve is normal in structure. Mild to moderate mitral valve  regurgitation. No evidence of mitral stenosis.   5. Tricuspid valve regurgitation is mild to moderate.   6. The aortic valve has an indeterminant number of cusps. Aortic valve  regurgitation is mild. No aortic stenosis is present.   7. There is borderline dilatation of the aortic root, measuring 37 mm.   8. The inferior vena cava is normal in size with <50% respiratory  variability, suggesting right atrial pressure of 8 mmHg.     Patient Profile     89 y.o. female  with a hx of osteoporosis, HTN, GERD, hypercholesterolemia, h/o falls who is being seen 04/22/2023 for the evaluation of new onset afib   Assessment & Plan    New onset Afib - patient presented with a fall found to be in rapid afib started on IV dilt with improvement of heart rates. She reported dizziness, lightheadedness and weakness - patient converted to NSR  - echo in 12/2022 showed LVEF 60-65%, no WMA, G1DD, mild to mod MR, mild AI ,small pericardial effusion  repeat echo has been ordered - CHADSVASC (age x2, female, HTN) at least 4. On IV heparin. She will require long-term a/c (with low dose) - can consolidate IV dilt to oral  meds   Elevated troponin - troponin elevated in the setting of rapid afib - HS troponin trending up to 906 - no chest pain reported - IV heparin - no h/o CAD - can consider noninvasive ischemic testing at some point   HTN - IV dilt> consolidate to oral dilt - BP low at times   Abnormal thyroid function - elevated TSH and FT4 - per IM   Thoracic compression fx - per IM  For questions or updates, please contact [facility] Please consult [email or web address] for contact info under        Signed, Osby Sweetin David Stall, PA-C  04/23/2023, 8:12 AM

## 2023-04-23 NOTE — Progress Notes (Signed)
[facility] Progress Note  Patient: Connie Schwartz    ZOX:[id number]  DOA: 04/22/2023     Date of Service: the patient was seen and examined on 04/23/2023  Chief Complaint  Patient presents with   Fall   Dizziness   Brief hospital course:  Alinna Siple is a 89 y.o. female with MPH of HTN, osteoporosis, and compression fractures, presented at [facility] ED status post fall due to dizziness.  After 4 patient was having some backache, denied any palpitations or chest pain.  Patient was found to have new onset A-fib with RVR.  Cardiology was consulted and [facility] consulted for admission and further management as below.   Assessment and Plan:  # New onset paroxysmal A-fib with RVR  Converted back to normal sinus rhythm S/p heparin IV infusion and Cardizem IV infusion, weaned off Continue to monitor on telemetry Started Cardizem CD 120 mg p.o. daily and Eliquis 2.5 mg p.o. twice daily low-dose based on age and body weight Continue Crestor 20 mg p.o. daily Follow lipid profile Follow echocardiogram Follow cardiology for further recommendation  # Elevated troponin most likely demand ischemia as per cardiology due to A-fib with RVR. Type II MI Follow 2D echocardiogram No ischemic evaluation at this time  # Abnormal thyroid profile TSH 5.6 elevated and free T41.27 Elevated Repeat thyroid profile and follow free T3 level  # Thoracic spine fracture X-ray T-spine: Midthoracic vertebral body wedging which is new, likely T7 or T8. No indication of retropulsion or listhesis. Subjective generalized osteopenia Continue as needed medication for pain control  # Chronic lumbar L2 fracture X-ray LS spine: 1. Chronic L2 compression fracture. 2. Lumbar spine degeneration with L4-5 anterolisthesis.   Body mass index is 21.43 kg/m.  Interventions:  Diet: Regular diet DVT Prophylaxis: Therapeutic Anticoagulation with Eliquis    Advance goals of care discussion: Limited code DNR/DNI  Family  Communication: family was present at bedside, at the time of interview.  The pt provided permission to discuss medical plan with the family. Opportunity was given to ask question and all questions were answered satisfactorily.   Disposition:  Pt is from assisted living facility, admitted with fall and A-fib with RVR, still has pain and risk of fall, which precludes a safe discharge. Discharge to ALF versus SNF, pending PT/OT eval, when stable, may discharge in 1 to 2 days.  Subjective: No significant events overnight, patient denies any chest pain or palpitation.  Denies any shortness of breath, no new active issues.  Physical Exam: General: NAD, lying comfortably Appear in no distress, affect appropriate Eyes: PERRLA ENT: Oral Mucosa Clear, moist  Neck: no JVD,  Cardiovascular: S1 and S2 Present, no Murmur,  Respiratory: good respiratory effort, Bilateral Air entry equal and Decreased, no Crackles, no wheezes Abdomen: Bowel Sound present, Soft and no tenderness,  Skin: no rashes Extremities: no Pedal edema, no calf tenderness Neurologic: without any new focal findings Gait not checked due to patient safety concerns  Vitals:   04/23/23 0400 04/23/23 0500 04/23/23 0600 04/23/23 0700  BP: (!) 104/51 114/63 (!) 121/55   Pulse: 67 74 65   Resp: (!) 23 17 20    Temp:    98.5 F (36.9 C)  TempSrc:    Oral  SpO2: 94% 97% 94%   Weight:      Height:        Intake/Output Summary (Last 24 hours) at 04/23/2023 1436 Last data filed at 04/23/2023 0400 Gross per 24 hour  Intake 216.38 ml  Output --  Net 216.38 ml   Filed Weights   04/22/23 0433  Weight: 54 kg    Data Reviewed: I have personally reviewed and interpreted daily labs, tele strips, imagings as discussed above. I reviewed all nursing notes, pharmacy notes, vitals, pertinent old records I have discussed plan of care as described above with RN and patient/family.  CBC: Recent Labs  Lab 04/22/23 0413 04/23/23 0515   WBC 7.0 8.0  HGB 12.0 11.7*  HCT 36.4 34.8*  MCV 89.9 87.4  PLT 236 237   Basic Metabolic Panel: Recent Labs  Lab 04/22/23 0413 04/23/23 0515  NA 136 134*  K 3.5 3.6  CL 102 103  CO2 21* 24  GLUCOSE 119* 105*  BUN 13 14  CREATININE 0.92 0.79  CALCIUM 8.7* 8.2*  MG 1.9 2.0  PHOS  --  3.5    Studies: No results found.  Scheduled Meds:  apixaban  2.5 mg Oral BID   diltiazem  120 mg Oral Daily   pantoprazole  40 mg Oral Daily   rosuvastatin  20 mg Oral Daily   sodium chloride flush  3 mL Intravenous Q12H   Continuous Infusions: PRN Meds: acetaminophen, sodium chloride flush  Time spent: 55 minutes  Author: Gillis Santa. MD [facility] 04/23/2023 2:36 PM  To reach On-call, see care teams to locate the attending and reach out to them via [email or web address]. If 7PM-7AM, please contact night-coverage If you still have difficulty reaching the attending provider, please page the [facility] (Director on Call) for [facility] on amion for assistance.

## 2023-04-23 NOTE — TOC Benefit Eligibility Note (Signed)
Patient Product/process development scientist completed.    The patient is insured through [employer]. Patient has Medicare and is not eligible for a copay card, but may be able to apply for patient assistance, if available.    Ran test claim for Eliquis 2.5 mg and the current 30 day co-pay is $93.53.  Ran test claim for Eliquis 15 mg and the current 30 day co-pay is $89.62.  This test claim was processed through [facility]- copay amounts may vary at other pharmacies due to pharmacy/plan contracts, or as the patient moves through the different stages of their insurance plan.     Roland Earl, CPHT Pharmacy Technician III Certified Patient Advocate [facility] Patient Advocate Team Direct Number: [phone]  Fax: [phone]

## 2023-04-23 NOTE — Plan of Care (Signed)

## 2023-04-24 ENCOUNTER — Inpatient Hospital Stay: Payer: Medicare Other

## 2023-04-24 DIAGNOSIS — I2489 Other forms of acute ischemic heart disease: Secondary | ICD-10-CM | POA: Diagnosis not present

## 2023-04-24 DIAGNOSIS — R296 Repeated falls: Secondary | ICD-10-CM | POA: Diagnosis not present

## 2023-04-24 DIAGNOSIS — I4891 Unspecified atrial fibrillation: Secondary | ICD-10-CM | POA: Diagnosis not present

## 2023-04-24 LAB — MAGNESIUM: Magnesium: 2 mg/dL (ref 1.7–2.4)

## 2023-04-24 LAB — BASIC METABOLIC PANEL
Anion gap: 10 (ref 5–15)
BUN: 19 mg/dL (ref 8–23)
CO2: 20 mmol/L — ABNORMAL LOW (ref 22–32)
Calcium: 8.6 mg/dL — ABNORMAL LOW (ref 8.9–10.3)
Chloride: 107 mmol/L (ref 98–111)
Creatinine, Ser: 0.91 mg/dL (ref 0.44–1.00)
GFR, Estimated: 59 mL/min — ABNORMAL LOW (ref >60)
Glucose, Bld: 110 mg/dL — ABNORMAL HIGH (ref 70–99)
Potassium: 4.1 mmol/L (ref 3.5–5.1)
Sodium: 137 mmol/L (ref 135–145)

## 2023-04-24 LAB — CBC
HCT: 34.3 % — ABNORMAL LOW (ref 36.0–46.0)
Hemoglobin: 11.8 g/dL — ABNORMAL LOW (ref 12.0–15.0)
MCH: 29.7 pg (ref 26.0–34.0)
MCHC: 34.4 g/dL (ref 30.0–36.0)
MCV: 86.4 fL (ref 80.0–100.0)
Platelets: 235 10*3/uL (ref 150–400)
RBC: 3.97 MIL/uL (ref 3.87–5.11)
RDW: 14.6 % (ref 11.5–15.5)
WBC: 13.1 10*3/uL — ABNORMAL HIGH (ref 4.0–10.5)
nRBC: 0 % (ref 0.0–0.2)

## 2023-04-24 LAB — T4, FREE: Free T4: 1.22 ng/dL — ABNORMAL HIGH (ref 0.61–1.12)

## 2023-04-24 LAB — PHOSPHORUS: Phosphorus: 2.9 mg/dL (ref 2.5–4.6)

## 2023-04-24 LAB — TSH: TSH: 1.566 u[IU]/mL (ref 0.350–4.500)

## 2023-04-24 NOTE — Progress Notes (Addendum)
[facility] Progress Note  Patient: Connie Schwartz    DGU:[id number]  DOA: 04/22/2023     Date of Service: the patient was seen and examined on 04/24/2023  Chief Complaint  Patient presents with   Fall   Dizziness   Brief hospital course:  Connie Schwartz is a 89 y.o. female with MPH of HTN, osteoporosis, and compression fractures, presented at [facility] ED status post fall due to dizziness.  After 4 patient was having some backache, denied any palpitations or chest pain.  Patient was found to have new onset A-fib with RVR.  Cardiology was consulted and [facility] consulted for admission and further management as below.   Assessment and Plan:  # New onset paroxysmal A-fib with RVR  Converted back to normal sinus rhythm S/p heparin IV infusion and Cardizem IV infusion, weaned off Continue to monitor on telemetry Started Cardizem CD 120 mg p.o. daily and Eliquis 2.5 mg p.o. twice daily low-dose based on age and body weight Continue Crestor 20 mg p.o. daily TTE LVEF 60 to 65%, grade 1 diastolic dysfunction, moderate MR/TR lipid profile LDL 118 Follow cardiology for further recommendation  # Elevated troponin most likely demand ischemia as per cardiology due to A-fib with RVR. Type II MI Follow 2D echocardiogram No ischemic evaluation at this time  # Abnormal thyroid profile 10/15 TSH 5.6 elevated and free T41.27 Elevated 10/17 TSH 1.56 within normal range, free T4 1.22 still elevated Follow free T3 level, thyroid antibodies and thyroid sonogram If free T3 level is elevated then consider starting methimazole and referral to endocrine as an outpatient   # Thoracic spine fracture X-ray T-spine: Midthoracic vertebral body wedging which is new, likely T7 or T8. No indication of retropulsion or listhesis. Subjective generalized osteopenia Continue as needed medication for pain control  # Chronic lumbar L2 fracture X-ray LS spine: 1. Chronic L2 compression fracture. 2. Lumbar spine  degeneration with L4-5 anterolisthesis.   Body mass index is 21.43 kg/m.  Interventions:  Diet: Regular diet DVT Prophylaxis: Therapeutic Anticoagulation with Eliquis    Advance goals of care discussion: Limited code DNR/DNI  Family Communication: family was present at bedside, at the time of interview.  The pt provided permission to discuss medical plan with the family. Opportunity was given to ask question and all questions were answered satisfactorily.   Disposition:  Pt is from assisted living facility, admitted with fall and A-fib with RVR, still has pain and risk of fall, which precludes a safe discharge. Discharge to ALF versus SNF, pending PT/OT eval,  Patient is stable to discharge, follow TOC for discharge planning.    Subjective: Patient was seen and examined at bedside in the morning. No significant events overnight, back pain is well-controlled.  Denied any chest pain or palpitations.  Patient was complaining of dysphagia sometimes sticking out pills in the throat area, patient will benefit from a speech and swallow eval.  Consult placed.  Physical Exam: General: NAD, lying comfortably Appear in no distress, affect appropriate Eyes: PERRLA ENT: Oral Mucosa Clear, moist  Neck: no JVD,  Cardiovascular: S1 and S2 Present, no Murmur,  Respiratory: good respiratory effort, Bilateral Air entry equal and Decreased, no Crackles, no wheezes Abdomen: Bowel Sound present, Soft and no tenderness,  Skin: no rashes Extremities: no Pedal edema, no calf tenderness Neurologic: without any new focal findings Gait not checked due to patient safety concerns  Vitals:   04/24/23 0000 04/24/23 0400 04/24/23 0804 04/24/23 1300  BP: 126/61 (!) 105/53  Pulse: 82 85    Resp: 20 20    Temp: 98.4 F (36.9 C) 98.5 F (36.9 C) 98.2 F (36.8 C) 98.7 F (37.1 C)  TempSrc:    Oral  SpO2: 97% 96%    Weight:      Height:       No intake or output data in the 24 hours ending 04/24/23  1416  Filed Weights   04/22/23 0433  Weight: 54 kg    Data Reviewed: I have personally reviewed and interpreted daily labs, tele strips, imagings as discussed above. I reviewed all nursing notes, pharmacy notes, vitals, pertinent old records I have discussed plan of care as described above with RN and patient/family.  CBC: Recent Labs  Lab 04/22/23 0413 04/23/23 0515 04/24/23 0430  WBC 7.0 8.0 13.1*  HGB 12.0 11.7* 11.8*  HCT 36.4 34.8* 34.3*  MCV 89.9 87.4 86.4  PLT 236 237 235   Basic Metabolic Panel: Recent Labs  Lab 04/22/23 0413 04/23/23 0515 04/24/23 0430  NA 136 134* 137  K 3.5 3.6 4.1  CL 102 103 107  CO2 21* 24 20*  GLUCOSE 119* 105* 110*  BUN 13 14 19   CREATININE 0.92 0.79 0.91  CALCIUM 8.7* 8.2* 8.6*  MG 1.9 2.0 2.0  PHOS  --  3.5 2.9    Studies: ECHOCARDIOGRAM COMPLETE  Result Date: 04/23/2023    ECHOCARDIOGRAM REPORT   Patient Name:   Connie Schwartz Date of Exam: 04/23/2023 Medical Rec #:  [id number]    Height:       62.5 in Accession #:    [id number]   Weight:       119.0 lb Date of Birth:  1928/09/09     BSA:          1.543 m Patient Age:    89 years     BP:           121/55 mmHg Patient Gender: F            HR:           92 bpm. Exam Location:  [facility] Procedure: 2D Echo, Cardiac Doppler and Color Doppler Indications:     Atrial Fibrillation  History:         Patient has prior history of Echocardiogram examinations, most                  recent 12/27/2022. Arrythmias:Atrial Fibrillation,                  Signs/Symptoms:Shortness of Breath; Risk Factors:Hypertension                  and Non-Smoker.  Sonographer:     Mikki Harbor Referring Phys:  [id number] Antonieta Iba Diagnosing Phys: Julien Nordmann MD IMPRESSIONS  1. Left ventricular ejection fraction, by estimation, is 60 to 65%. The left ventricle has normal function. The left ventricle has no regional wall motion abnormalities. There is mild left ventricular hypertrophy. Left ventricular diastolic  parameters are consistent with Grade I diastolic dysfunction (impaired relaxation).  2. Right ventricular systolic function is normal. The right ventricular size is normal. Mildly increased right ventricular wall thickness. There is normal pulmonary artery systolic pressure. The estimated right ventricular systolic pressure is 34.6 mmHg.  3. The mitral valve is normal in structure. Moderate mitral valve regurgitation. No evidence of mitral stenosis.  4. Tricuspid valve regurgitation is moderate.  5. The aortic valve is tricuspid. Aortic valve regurgitation is mild. No  aortic stenosis is present.  6. There is borderline dilatation of the aortic root, measuring 37 mm.  7. The inferior vena cava is normal in size with greater than 50% respiratory variability, suggesting right atrial pressure of 3 mmHg. FINDINGS  Left Ventricle: Left ventricular ejection fraction, by estimation, is 60 to 65%. The left ventricle has normal function. The left ventricle has no regional wall motion abnormalities. The left ventricular internal cavity size was normal in size. There is  mild left ventricular hypertrophy. Left ventricular diastolic parameters are consistent with Grade I diastolic dysfunction (impaired relaxation). Right Ventricle: The right ventricular size is normal. Mildly increased right ventricular wall thickness. Right ventricular systolic function is normal. There is normal pulmonary artery systolic pressure. The tricuspid regurgitant velocity is 2.81 m/s, and with an assumed right atrial pressure of 3 mmHg, the estimated right ventricular systolic pressure is 34.6 mmHg. Left Atrium: Left atrial size was normal in size. Right Atrium: Right atrial size was normal in size. Pericardium: There is no evidence of pericardial effusion. Mitral Valve: The mitral valve is normal in structure. Moderate mitral valve regurgitation. No evidence of mitral valve stenosis. MV peak gradient, 2.5 mmHg. The mean mitral valve gradient is 1.0  mmHg. Tricuspid Valve: The tricuspid valve is normal in structure. Tricuspid valve regurgitation is moderate . No evidence of tricuspid stenosis. Aortic Valve: The aortic valve is tricuspid. Aortic valve regurgitation is mild. No aortic stenosis is present. Aortic valve mean gradient measures 3.0 mmHg. Aortic valve peak gradient measures 6.0 mmHg. Aortic valve area, by VTI measures 2.88 cm. Pulmonic Valve: The pulmonic valve was normal in structure. Pulmonic valve regurgitation is mild. No evidence of pulmonic stenosis. Aorta: The aortic root is normal in size and structure. There is borderline dilatation of the aortic root, measuring 37 mm. Venous: The inferior vena cava is normal in size with greater than 50% respiratory variability, suggesting right atrial pressure of 3 mmHg. IAS/Shunts: No atrial level shunt detected by color flow Doppler.  LEFT VENTRICLE PLAX 2D LVIDd:         3.70 cm   Diastology LVIDs:         2.40 cm   LV e' medial:    5.87 cm/s LV PW:         1.30 cm   LV E/e' medial:  10.3 LV IVS:        1.10 cm   LV e' lateral:   7.07 cm/s LVOT diam:     1.90 cm   LV E/e' lateral: 8.5 LV SV:         61 LV SV Index:   40 LVOT Area:     2.84 cm  RIGHT VENTRICLE RV Basal diam:  3.15 cm RV Mid diam:    2.80 cm RV S prime:     19.80 cm/s LEFT ATRIUM             Index        RIGHT ATRIUM           Index LA diam:        2.90 cm 1.88 cm/m   RA Area:     14.40 cm LA Vol (A2C):   26.7 ml 17.31 ml/m  RA Volume:   36.40 ml  23.60 ml/m LA Vol (A4C):   30.9 ml 20.03 ml/m LA Biplane Vol: 28.9 ml 18.73 ml/m  AORTIC VALVE  PULMONIC VALVE AV Area (Vmax):    2.70 cm     PV Vmax:       1.04 m/s AV Area (Vmean):   2.38 cm     PV Peak grad:  4.3 mmHg AV Area (VTI):     2.88 cm AV Vmax:           122.00 cm/s AV Vmean:          79.000 cm/s AV VTI:            0.212 m AV Peak Grad:      6.0 mmHg AV Mean Grad:      3.0 mmHg LVOT Vmax:         116.00 cm/s LVOT Vmean:        66.400 cm/s LVOT VTI:           0.215 m LVOT/AV VTI ratio: 1.01  AORTA Ao Root diam: 3.70 cm Ao Asc diam:  3.50 cm MITRAL VALVE               TRICUSPID VALVE MV Area (PHT): 3.10 cm    TR Peak grad:   31.6 mmHg MV Area VTI:   3.66 cm    TR Vmax:        281.00 cm/s MV Peak grad:  2.5 mmHg MV Mean grad:  1.0 mmHg    SHUNTS MV Vmax:       0.78 m/s    Systemic VTI:  0.22 m MV Vmean:      46.7 cm/s   Systemic Diam: 1.90 cm MV Decel Time: 245 msec MV E velocity: 60.30 cm/s MV A velocity: 81.70 cm/s MV E/A ratio:  0.74 Julien Nordmann MD Electronically signed by Julien Nordmann MD Signature Date/Time: 04/23/2023/6:11:52 PM    Final     Scheduled Meds:  apixaban  2.5 mg Oral BID   diltiazem  120 mg Oral Daily   feeding supplement (GLUCERNA SHAKE)  237 mL Oral TID BM   pantoprazole  40 mg Oral Daily   rosuvastatin  20 mg Oral Daily   sodium chloride flush  3 mL Intravenous Q12H   Continuous Infusions: PRN Meds: acetaminophen, morphine injection, sodium chloride flush  Time spent: 40 minutes  Author: Gillis Santa. MD [facility] 04/24/2023 2:16 PM  To reach On-call, see care teams to locate the attending and reach out to them via [email or web address]. If 7PM-7AM, please contact night-coverage If you still have difficulty reaching the attending provider, please page the [facility] (Director on Call) for [facility] on amion for assistance.

## 2023-04-24 NOTE — Progress Notes (Addendum)
Rounding Note    Patient Name: Connie Schwartz Date of Encounter: 04/24/2023  [facility] Cardiologist: Yvonne Kendall, MD   Subjective   Resting comfortably supine in bed Reports having difficulty sitting up, has not been able to stand secondary to back pain Telemetry reviewed, maintaining normal sinus rhythm Was transition to diltiazem ER 120 mg yesterday with Eliquis 2.5  Inpatient Medications    Scheduled Meds:  apixaban  2.5 mg Oral BID   diltiazem  120 mg Oral Daily   feeding supplement (GLUCERNA SHAKE)  237 mL Oral TID BM   pantoprazole  40 mg Oral Daily   rosuvastatin  20 mg Oral Daily   sodium chloride flush  3 mL Intravenous Q12H   Continuous Infusions:  PRN Meds: acetaminophen, morphine injection, sodium chloride flush   Vital Signs    Vitals:   04/23/23 1900 04/24/23 0000 04/24/23 0400 04/24/23 0804  BP: (!) 118/58 126/61 (!) 105/53   Pulse: 95 82 85   Resp: 18 20 20    Temp: 98.5 F (36.9 C) 98.4 F (36.9 C) 98.5 F (36.9 C) 98.2 F (36.8 C)  TempSrc: Oral     SpO2: 95% 97% 96%   Weight:      Height:       No intake or output data in the 24 hours ending 04/24/23 1048    04/22/2023    4:33 AM 12/25/2022    9:43 AM 09/15/2020    9:44 AM  Last 3 Weights  Weight (lbs) 119 lb 0.8 oz 117 lb 105 lb  Weight (kg) 54 kg 53.071 kg 47.628 kg      Telemetry    Normal sinus rhythm- Personally Reviewed  ECG     - Personally Reviewed  Physical Exam   GEN: No acute distress.   Neck: No JVD Cardiac: RRR, no murmurs, rubs, or gallops.  Respiratory: Clear to auscultation bilaterally. GI: Soft, nontender, non-distended  MS: No edema; No deformity. Neuro:  Nonfocal  Psych: Normal affect   Labs    High Sensitivity Troponin:   Recent Labs  Lab 04/22/23 0413 04/22/23 0638 04/22/23 1023  TROPONINIHS 15 305* 906*     Chemistry Recent Labs  Lab 04/22/23 0413 04/23/23 0515 04/24/23 0430  NA 136 134* 137  K 3.5 3.6 4.1  CL 102  103 107  CO2 21* 24 20*  GLUCOSE 119* 105* 110*  BUN 13 14 19   CREATININE 0.92 0.79 0.91  CALCIUM 8.7* 8.2* 8.6*  MG 1.9 2.0 2.0  GFRNONAA 58* >60 59*  ANIONGAP 13 7 10     Lipids  Recent Labs  Lab 04/23/23 0751  CHOL 211*  TRIG 48  HDL 83  LDLCALC 118*  CHOLHDL 2.5    Hematology Recent Labs  Lab 04/22/23 0413 04/23/23 0515 04/24/23 0430  WBC 7.0 8.0 13.1*  RBC 4.05 3.98 3.97  HGB 12.0 11.7* 11.8*  HCT 36.4 34.8* 34.3*  MCV 89.9 87.4 86.4  MCH 29.6 29.4 29.7  MCHC 33.0 33.6 34.4  RDW 14.6 14.6 14.6  PLT 236 237 235   Thyroid  Recent Labs  Lab 04/24/23 0430  TSH 1.566  FREET4 1.22*    BNPNo results for input(s): "BNP", "PROBNP" in the last 168 hours.  DDimer No results for input(s): "DDIMER" in the last 168 hours.   Radiology    ECHOCARDIOGRAM COMPLETE  Result Date: 04/23/2023    ECHOCARDIOGRAM REPORT   Patient Name:   Connie Schwartz Date of Exam: 04/23/2023 Medical Rec #:  [id number]    Height:       62.5 in Accession #:    [id number]   Weight:       119.0 lb Date of Birth:  [birth year]     BSA:          1.543 m Patient Age:    89 years     BP:           121/55 mmHg Patient Gender: F            HR:           92 bpm. Exam Location:  [facility] Procedure: 2D Echo, Cardiac Doppler and Color Doppler Indications:     Atrial Fibrillation  History:         Patient has prior history of Echocardiogram examinations, most                  recent 12/27/2022. Arrythmias:Atrial Fibrillation,                  Signs/Symptoms:Shortness of Breath; Risk Factors:Hypertension                  and Non-Smoker.  Sonographer:     Mikki Harbor Referring Phys:  [id number] Antonieta Iba Diagnosing Phys: Julien Nordmann MD IMPRESSIONS  1. Left ventricular ejection fraction, by estimation, is 60 to 65%. The left ventricle has normal function. The left ventricle has no regional wall motion abnormalities. There is mild left ventricular hypertrophy. Left ventricular diastolic parameters are consistent with  Grade I diastolic dysfunction (impaired relaxation).  2. Right ventricular systolic function is normal. The right ventricular size is normal. Mildly increased right ventricular wall thickness. There is normal pulmonary artery systolic pressure. The estimated right ventricular systolic pressure is 34.6 mmHg.  3. The mitral valve is normal in structure. Moderate mitral valve regurgitation. No evidence of mitral stenosis.  4. Tricuspid valve regurgitation is moderate.  5. The aortic valve is tricuspid. Aortic valve regurgitation is mild. No aortic stenosis is present.  6. There is borderline dilatation of the aortic root, measuring 37 mm.  7. The inferior vena cava is normal in size with greater than 50% respiratory variability, suggesting right atrial pressure of 3 mmHg. FINDINGS  Left Ventricle: Left ventricular ejection fraction, by estimation, is 60 to 65%. The left ventricle has normal function. The left ventricle has no regional wall motion abnormalities. The left ventricular internal cavity size was normal in size. There is  mild left ventricular hypertrophy. Left ventricular diastolic parameters are consistent with Grade I diastolic dysfunction (impaired relaxation). Right Ventricle: The right ventricular size is normal. Mildly increased right ventricular wall thickness. Right ventricular systolic function is normal. There is normal pulmonary artery systolic pressure. The tricuspid regurgitant velocity is 2.81 m/s, and with an assumed right atrial pressure of 3 mmHg, the estimated right ventricular systolic pressure is 34.6 mmHg. Left Atrium: Left atrial size was normal in size. Right Atrium: Right atrial size was normal in size. Pericardium: There is no evidence of pericardial effusion. Mitral Valve: The mitral valve is normal in structure. Moderate mitral valve regurgitation. No evidence of mitral valve stenosis. MV peak gradient, 2.5 mmHg. The mean mitral valve gradient is 1.0 mmHg. Tricuspid Valve: The  tricuspid valve is normal in structure. Tricuspid valve regurgitation is moderate . No evidence of tricuspid stenosis. Aortic Valve: The aortic valve is tricuspid. Aortic valve regurgitation is mild. No aortic stenosis is present. Aortic valve mean gradient measures 3.0 mmHg. Aortic valve  peak gradient measures 6.0 mmHg. Aortic valve area, by VTI measures 2.88 cm. Pulmonic Valve: The pulmonic valve was normal in structure. Pulmonic valve regurgitation is mild. No evidence of pulmonic stenosis. Aorta: The aortic root is normal in size and structure. There is borderline dilatation of the aortic root, measuring 37 mm. Venous: The inferior vena cava is normal in size with greater than 50% respiratory variability, suggesting right atrial pressure of 3 mmHg. IAS/Shunts: No atrial level shunt detected by color flow Doppler.  LEFT VENTRICLE PLAX 2D LVIDd:         3.70 cm   Diastology LVIDs:         2.40 cm   LV e' medial:    5.87 cm/s LV PW:         1.30 cm   LV E/e' medial:  10.3 LV IVS:        1.10 cm   LV e' lateral:   7.07 cm/s LVOT diam:     1.90 cm   LV E/e' lateral: 8.5 LV SV:         61 LV SV Index:   40 LVOT Area:     2.84 cm  RIGHT VENTRICLE RV Basal diam:  3.15 cm RV Mid diam:    2.80 cm RV S prime:     19.80 cm/s LEFT ATRIUM             Index        RIGHT ATRIUM           Index LA diam:        2.90 cm 1.88 cm/m   RA Area:     14.40 cm LA Vol (A2C):   26.7 ml 17.31 ml/m  RA Volume:   36.40 ml  23.60 ml/m LA Vol (A4C):   30.9 ml 20.03 ml/m LA Biplane Vol: 28.9 ml 18.73 ml/m  AORTIC VALVE                    PULMONIC VALVE AV Area (Vmax):    2.70 cm     PV Vmax:       1.04 m/s AV Area (Vmean):   2.38 cm     PV Peak grad:  4.3 mmHg AV Area (VTI):     2.88 cm AV Vmax:           122.00 cm/s AV Vmean:          79.000 cm/s AV VTI:            0.212 m AV Peak Grad:      6.0 mmHg AV Mean Grad:      3.0 mmHg LVOT Vmax:         116.00 cm/s LVOT Vmean:        66.400 cm/s LVOT VTI:          0.215 m LVOT/AV VTI ratio:  1.01  AORTA Ao Root diam: 3.70 cm Ao Asc diam:  3.50 cm MITRAL VALVE               TRICUSPID VALVE MV Area (PHT): 3.10 cm    TR Peak grad:   31.6 mmHg MV Area VTI:   3.66 cm    TR Vmax:        281.00 cm/s MV Peak grad:  2.5 mmHg MV Mean grad:  1.0 mmHg    SHUNTS MV Vmax:       0.78 m/s    Systemic VTI:  0.22 m MV Vmean:  46.7 cm/s   Systemic Diam: 1.90 cm MV Decel Time: 245 msec MV E velocity: 60.30 cm/s MV A velocity: 81.70 cm/s MV E/A ratio:  0.74 Julien Nordmann MD Electronically signed by Julien Nordmann MD Signature Date/Time: 04/23/2023/6:11:52 PM    Final     Cardiac Studies     Patient Profile     Maud Rubendall is a 89 y.o. female with a hx of osteoporosis, HTN, GERD, hypercholesterolemia, h/o falls who is being seen 04/22/2023 for the evaluation of new onset afib   Assessment & Plan    New onset atrial fibrillation with RVR Presenting to the hospital after a fall, noted to be in atrial fibrillation with RVR started on diltiazem infusion with improved rate Converting to normal sinus rhythm overnight shortly after admission on diltiazem infusion 5 mg/h -Echo with normal ejection fraction EF 60% -CHADS VASC at least 4.  -2 years ago reports having issues with peripheral vision concerning for TIA though details unclear -Would recommend we continue diltiazem ER 120 daily if blood pressure tolerates, Eliquis 2.5 twice daily -For recurrent atrial fibrillation episodes, may need to consider adding antiarrhythmic -For symptomatic hypotension may need to transition off diltiazem ER and onto metoprolol tartrate   Elevated troponin Likely supply/demand mismatch in the setting of atrial fibrillation with RVR Heart rates reported 160 up to 220 -Denies anginal symptoms -Echo with normal LV function no focal wall motion abnormality -No plan at this time for ischemic workup -Additional consideration for ischemic workup as outpatient if she develops anginal symptoms   Essential  hypertension -Continue diltiazem ER 120 daily with close monitoring of blood pressure   History of falls, fractures Prior fall 2022 Fall at [facility] prior to admission in the setting of atrial fibrillation, felt dizzy Notes indicating she got up to go to the bathroom felt lightheaded and short of breath and slid down to the floor. -Unable to sit up without back pain  [facility] will sign off.   Medication Recommendations: No further medication changes Other recommendations (labs, testing, etc): No further testing Follow up as an outpatient: We will send message to our scheduler to arrange outpatient cardiology follow-up in several weeks time  For questions or updates, please contact [facility] Please consult [email or web address] for contact info under        Signed, Julien Nordmann, MD  04/24/2023, 10:48 AM

## 2023-04-24 NOTE — Evaluation (Signed)
Physical Therapy Evaluation Patient Details Name: Connie Schwartz MRN: [id number] DOB: 27-Sep-1928 Today's Date: 04/24/2023  History of Present Illness  Pt is a 89 y.o. female with PMH of HTN, osteoporosis, and compression fractures, presented at [facility] ED on 04/22/23 s/p fall due to dizziness. Pt was having some backache, denied any palpitations or chest pain.  Pt was found to have new onset A-fib with RVR.  Clinical Impression   Pt is received in bed, she is agreeable to PT session. At baseline, Pt reports ambulating community distance with use of RW, no fall history, and requiring assist with medication management by ALF staff but otherwise mod I-family not at bedside to verify information. Pt is AO x4. Pt performs bed mobility close supA, transfers and amb CGA for safety. Pt limited to amb distance of 5 ft using RW at this time due to being attached to medical equipment. VSS monitored throughout session and WNL with no reports of dizziness. Educated Pt on use of RW during all standing activities to prevent falls-Pt verbalized understanding. Pt would benefit from skilled PT to address above deficits and promote optimal return to PLOF.      If plan is discharge home, recommend the following: A little help with walking and/or transfers;A little help with bathing/dressing/bathroom;Assistance with cooking/housework;Direct supervision/assist for medications management;Assist for transportation   Can travel by private vehicle        Equipment Recommendations None recommended by PT  Recommendations for Other Services       Functional Status Assessment Patient has had a recent decline in their functional status and demonstrates the ability to make significant improvements in function in a reasonable and predictable amount of time.     Precautions / Restrictions Precautions Precautions: Fall Restrictions Weight Bearing Restrictions: No      Mobility  Bed Mobility Overal bed mobility: Needs  Assistance Bed Mobility: Supine to Sit     Supine to sit: Supervision, HOB elevated, Used rails     General bed mobility comments: Pt able to perform bed mobility with increased time to complete task with use of railings and HOB elevated, close supA for safety    Transfers Overall transfer level: Needs assistance Equipment used: Rolling walker (2 wheels) Transfers: Bed to chair/wheelchair/BSC     Step pivot transfers: Contact guard assist       General transfer comment: Pt able to perform SPT using RW with min cuing necessary for hand placement; Pt demonstrates ability to scoot to EOB without cuing    Ambulation/Gait Ambulation/Gait assistance: Contact guard assist Gait Distance (Feet): 5 Feet Assistive device: Rolling walker (2 wheels) Gait Pattern/deviations: Step-through pattern, Decreased step length - right, Decreased step length - left, Decreased stride length Gait velocity: decreased     General Gait Details: able to amb approx 5 ft using RW with steadiness  Stairs            Wheelchair Mobility     Tilt Bed    Modified Rankin (Stroke Patients Only)       Balance Overall balance assessment: Needs assistance Sitting-balance support: Feet supported Sitting balance-Leahy Scale: Good Sitting balance - Comments: able to maintain seated EOB during functional activities   Standing balance support: Bilateral upper extremity supported, During functional activity Standing balance-Leahy Scale: Good Standing balance comment: able to maintain static and dynamic standing balance with use of RW without swaying  Pertinent Vitals/Pain Pain Assessment Pain Assessment: No/denies pain    Home Living Family/patient expects to be discharged to:: Assisted living                 Home Equipment: Agricultural consultant (2 wheels);Shower seat;Grab bars - tub/shower      Prior Function Prior Level of Function :  Independent/Modified Independent             Mobility Comments: ambs with RW around ALF (Pt reports 6 laps) ADLs Comments: mod I with assist with medication intake, Pt reports using apple sauce     Extremity/Trunk Assessment   Upper Extremity Assessment Upper Extremity Assessment: Defer to OT evaluation;Overall WFL for tasks assessed    Lower Extremity Assessment Lower Extremity Assessment: Generalized weakness    Cervical / Trunk Assessment Cervical / Trunk Assessment: Normal  Communication   Communication Communication: No apparent difficulties Cueing Techniques: Verbal cues  Cognition Arousal: Alert Behavior During Therapy: WFL for tasks assessed/performed Overall Cognitive Status: Within Functional Limits for tasks assessed                                 General Comments: AO x4; pleasant and cooperative with PT slightly talkative        General Comments General comments (skin integrity, edema, etc.): VSS monitored throughout session and WNL    Exercises Other Exercises Other Exercises: Educated Pt on use of RW at all times for safety   Assessment/Plan    PT Assessment Patient needs continued PT services  PT Problem List Decreased strength;Decreased mobility;Decreased activity tolerance;Decreased balance;Decreased range of motion       PT Treatment Interventions DME instruction;Therapeutic exercise;Gait training;Functional mobility training;Therapeutic activities    PT Goals (Current goals can be found in the Care Plan section)  Acute Rehab PT Goals Patient Stated Goal: to go back to [facility] PT Goal Formulation: With patient Time For Goal Achievement: 05/08/23 Potential to Achieve Goals: Good    Frequency Min 1X/week     Co-evaluation               AM-PAC PT "6 Clicks" Mobility  Outcome Measure Help needed turning from your back to your side while in a flat bed without using bedrails?: None Help needed moving from lying  on your back to sitting on the side of a flat bed without using bedrails?: None Help needed moving to and from a bed to a chair (including a wheelchair)?: A Little Help needed standing up from a chair using your arms (e.g., wheelchair or bedside chair)?: A Little Help needed to walk in hospital room?: A Little Help needed climbing 3-5 steps with a railing? : A Lot 6 Click Score: 19    End of Session   Activity Tolerance: Patient tolerated treatment well Patient left: in chair;with call bell/phone within reach;with chair alarm set Nurse Communication: Mobility status PT Visit Diagnosis: Unsteadiness on feet (R26.81);Muscle weakness (generalized) (M62.81);History of falling (Z91.81);Dizziness and giddiness (R42)    Time: 1610-9604 PT Time Calculation (min) (ACUTE ONLY): 17 min   Charges:                 Elmon Else, SPT   Zaydenn Balaguer 04/24/2023, 10:34 AM

## 2023-04-24 NOTE — Evaluation (Signed)
Occupational Therapy Evaluation Patient Details Name: Connie Schwartz MRN: [id number] DOB: May 17, 1929 Today's Date: 04/24/2023   History of Present Illness Pt is a 89 y.o. female with PMH of HTN, osteoporosis, and compression fractures, presented at [facility] ED on 04/22/23 s/p fall due to dizziness. Pt was having some backache, denied any palpitations or chest pain.  Pt was found to have new onset A-fib with RVR.   Clinical Impression   Pt was seen for OT evaluation this date. Prior to hospital admission, pt was living in ALF and MOD I with ADLs and mobility using RW. She reports being very active and walking 6 laps often.    Pt presents to acute OT demonstrating impaired ADL performance and functional mobility 2/2 pain, weakness, and dizziness (See OT problem list for additional functional deficits). Pt currently requires CGA/SBA for STS from recliner with verb cues for hand placement. BP checked in sitting 102/61 and HR 86, then in standing and noted drop to 84/54. Pt asymptomatic and HR 114. Returned to seated and BP back up to 107/59. She performed grooming seated with set up and LB dressing to don/doff socks with SUP via pulling knees to chest and resting foot on recliner. Reports this is how she LB dresses at baseline. Pt limited to further mobility d/t BP drop. Pt would benefit from skilled OT services to address noted impairments and functional limitations (see below for any additional details) in order to maximize safety and independence while minimizing falls risk and caregiver burden. Recommend HHOT at ALF to ensure safety with ADLs to return to PLOF.     If plan is discharge home, recommend the following: A little help with walking and/or transfers;A little help with bathing/dressing/bathroom;Help with stairs or ramp for entrance;Assistance with cooking/housework    Functional Status Assessment  Patient has had a recent decline in their functional status and demonstrates the ability to make  significant improvements in function in a reasonable and predictable amount of time.  Equipment Recommendations  None recommended by OT    Recommendations for Other Services       Precautions / Restrictions Precautions Precautions: Fall Restrictions Weight Bearing Restrictions: No      Mobility Bed Mobility                    Transfers Overall transfer level: Needs assistance Equipment used: Rolling walker (2 wheels) Transfers: Sit to/from Stand Sit to Stand: Contact guard assist, Supervision           General transfer comment: CGA/SBA for STS from recliner to RW      Balance Overall balance assessment: Needs assistance Sitting-balance support: Feet supported Sitting balance-Leahy Scale: Good Sitting balance - Comments: able to maintain seated EOB during functional activities   Standing balance support: Bilateral upper extremity supported, During functional activity Standing balance-Leahy Scale: Good Standing balance comment: no LOB in standing static at RW                           ADL either performed or assessed with clinical judgement   ADL Overall ADL's : Needs assistance/impaired     Grooming: Wash/dry face;Set up;Sitting               Lower Body Dressing: Supervision/safety;Sitting/lateral leans Lower Body Dressing Details (indicate cue type and reason): seated in recliner                     Vision  Perception         Praxis         Pertinent Vitals/Pain Pain Assessment Pain Assessment: Faces Faces Pain Scale: Hurts a little bit Pain Location: back Pain Descriptors / Indicators: Sore Pain Intervention(s): Monitored during session     Extremity/Trunk Assessment Upper Extremity Assessment Upper Extremity Assessment: Overall WFL for tasks assessed   Lower Extremity Assessment Lower Extremity Assessment: Generalized weakness   Cervical / Trunk Assessment Cervical / Trunk Assessment: Normal    Communication Communication Communication: No apparent difficulties Cueing Techniques: Verbal cues   Cognition Arousal: Alert Behavior During Therapy: WFL for tasks assessed/performed Overall Cognitive Status: Within Functional Limits for tasks assessed                                 General Comments: AO x4; pleasant and cooperative with PT slightly talkative     General Comments  BP dropped from sitting to stand 102/61 to 84/54 in standing    Exercises     Shoulder Instructions      Home Living Family/patient expects to be discharged to:: Assisted living                             Home Equipment: Rolling Walker (2 wheels);Shower seat;Grab bars - tub/shower;Hand held shower head          Prior Functioning/Environment Prior Level of Function : Independent/Modified Independent             Mobility Comments: ambs with RW around ALF (Pt reports 6 laps) ADLs Comments: mod I with assist with medication intake, Pt reports using apple sauce; showers seated on chair and uses grab bars and HH shower head        OT Problem List: Decreased strength;Pain;Decreased activity tolerance      OT Treatment/Interventions: Self-care/ADL training;Therapeutic exercise;Therapeutic activities;Energy conservation;DME and/or AE instruction;Patient/family education    OT Goals(Current goals can be found in the care plan section) Acute Rehab OT Goals Patient Stated Goal: return to ALF OT Goal Formulation: With patient Time For Goal Achievement: 05/08/23 Potential to Achieve Goals: Good ADL Goals Pt Will Perform Lower Body Bathing: sitting/lateral leans;sit to/from stand;with supervision Pt Will Perform Lower Body Dressing: with supervision;sitting/lateral leans;sit to/from stand Pt Will Transfer to Toilet: with supervision;grab bars Pt Will Perform Toileting - Clothing Manipulation and hygiene: with supervision;sit to/from stand;sitting/lateral leans  OT  Frequency: Min 1X/week    Co-evaluation              AM-PAC OT "6 Clicks" Daily Activity     Outcome Measure Help from another person eating meals?: None Help from another person taking care of personal grooming?: None Help from another person toileting, which includes using toliet, bedpan, or urinal?: A Little Help from another person bathing (including washing, rinsing, drying)?: A Little Help from another person to put on and taking off regular upper body clothing?: A Little Help from another person to put on and taking off regular lower body clothing?: A Little 6 Click Score: 20   End of Session Equipment Utilized During Treatment: Rolling walker (2 wheels)  Activity Tolerance: Patient tolerated treatment well Patient left: in chair;with call bell/phone within reach;with chair alarm set  OT Visit Diagnosis: History of falling (Z91.81);Muscle weakness (generalized) (M62.81)                Time: 4259-5638 OT Time  Calculation (min): 29 min Charges:  OT General Charges $OT Visit: 1 Visit OT Evaluation $OT Eval Low Complexity: 1 Low OT Treatments $Self Care/Home Management : 8-22 mins Favio Moder, OTR/L 04/24/23, 1:09 PM  Justen Fonda E Maksymilian Mabey 04/24/2023, 1:06 PM

## 2023-04-25 ENCOUNTER — Ambulatory Visit: Payer: PRIVATE HEALTH INSURANCE | Admitting: Medical

## 2023-04-25 DIAGNOSIS — I10 Essential (primary) hypertension: Secondary | ICD-10-CM

## 2023-04-25 DIAGNOSIS — S22000S Wedge compression fracture of unspecified thoracic vertebra, sequela: Secondary | ICD-10-CM

## 2023-04-25 DIAGNOSIS — W19XXXA Unspecified fall, initial encounter: Secondary | ICD-10-CM

## 2023-04-25 DIAGNOSIS — I2489 Other forms of acute ischemic heart disease: Secondary | ICD-10-CM

## 2023-04-25 DIAGNOSIS — I4891 Unspecified atrial fibrillation: Secondary | ICD-10-CM | POA: Diagnosis not present

## 2023-04-25 LAB — CBC
HCT: 34.7 % — ABNORMAL LOW (ref 36.0–46.0)
Hemoglobin: 11.7 g/dL — ABNORMAL LOW (ref 12.0–15.0)
MCH: 29.5 pg (ref 26.0–34.0)
MCHC: 33.7 g/dL (ref 30.0–36.0)
MCV: 87.4 fL (ref 80.0–100.0)
Platelets: 231 10*3/uL (ref 150–400)
RBC: 3.97 MIL/uL (ref 3.87–5.11)
RDW: 14.6 % (ref 11.5–15.5)
WBC: 10.2 10*3/uL (ref 4.0–10.5)
nRBC: 0 % (ref 0.0–0.2)

## 2023-04-25 LAB — BASIC METABOLIC PANEL
Anion gap: 9 (ref 5–15)
BUN: 27 mg/dL — ABNORMAL HIGH (ref 8–23)
CO2: 23 mmol/L (ref 22–32)
Calcium: 8.6 mg/dL — ABNORMAL LOW (ref 8.9–10.3)
Chloride: 103 mmol/L (ref 98–111)
Creatinine, Ser: 0.9 mg/dL (ref 0.44–1.00)
GFR, Estimated: 60 mL/min — ABNORMAL LOW (ref >60)
Glucose, Bld: 112 mg/dL — ABNORMAL HIGH (ref 70–99)
Potassium: 4.1 mmol/L (ref 3.5–5.1)
Sodium: 135 mmol/L (ref 135–145)

## 2023-04-25 LAB — MAGNESIUM: Magnesium: 2.2 mg/dL (ref 1.7–2.4)

## 2023-04-25 LAB — THYROID ANTIBODIES
Thyroglobulin Antibody: <1 [IU]/mL (ref 0.0–0.9)
Thyroperoxidase Ab SerPl-aCnc: 12 [IU]/mL (ref 0–34)

## 2023-04-25 LAB — T3, FREE: T3, Free: 2.1 pg/mL (ref 2.0–4.4)

## 2023-04-25 LAB — PHOSPHORUS: Phosphorus: 3.7 mg/dL (ref 2.5–4.6)

## 2023-04-25 MED ORDER — ROSUVASTATIN CALCIUM 20 MG PO TABS: 20.0000 mg | ORAL_TABLET | Freq: Every day | ORAL | 1 refills | Status: AC

## 2023-04-25 MED ORDER — SACCHAROMYCES BOULARDII 250 MG PO CAPS
250.0000 mg | ORAL_CAPSULE | Freq: Two times a day (BID) | ORAL | Status: DC
  Administered 2023-04-25: 250 mg via ORAL
  Filled 2023-04-25 (×2): qty 1

## 2023-04-25 MED ORDER — GLUCERNA SHAKE PO LIQD: 237.0000 mL | Freq: Three times a day (TID) | ORAL | 11 refills | Status: AC

## 2023-04-25 MED ORDER — APIXABAN 2.5 MG PO TABS: 2.5000 mg | ORAL_TABLET | Freq: Two times a day (BID) | ORAL | 2 refills | Status: AC

## 2023-04-25 MED ORDER — DILTIAZEM HCL ER COATED BEADS 120 MG PO CP24: 120.0000 mg | ORAL_CAPSULE | Freq: Every day | ORAL | 2 refills | Status: AC

## 2023-04-25 NOTE — Discharge Summary (Signed)
Physician Discharge Summary   Patient: Connie Schwartz MRN: [id number] DOB: [birth year]  Admit date:     04/22/2023  Discharge date: 04/25/23  Discharge Physician: Arnetha Courser   PCP: Ellan Lambert, NP   Recommendations at discharge:  Follow-up with primary care provider Follow-up with cardiology Please obtain CBC and BMP on follow-up  Discharge Diagnoses: Principal Problem:   Atrial fibrillation with RVR (HCC) Active Problems:   Essential hypertension   Atrial fibrillation with rapid ventricular response (HCC)   Osteoporosis   Thoracic compression fracture [facility])   Fall   Demand ischemia [facility])  Hospital Course: Connie Schwartz is a 89 y.o. female with MPH of HTN, osteoporosis, and compression fractures, presented at [facility] ED status post fall due to dizziness.  She was found to be in new onset A-fib with RVR. CHADS VASC at least 4  Cardiology was consulted, A-fib was initially managed with diltiazem infusion which was later switched to p.o. Cardizem at 120 mg daily.  She was also started on Eliquis.  Patient spontaneously converted back to sinus rhythm.  She will need a close follow-up with cardiology and if having recurrent episodes then they might add an antiarrhythmic medication.  Orthostatic vitals were normal.  She was also found to have mildly elevated troponin, likely secondary to mismatch demand supply with A-fib.  Echocardiogram with normal EF.  Patient will continue the rest of her home medications and need to have a close follow-up with her providers for further management.   Consultants: Cardiology Procedures performed: None Disposition: Assisted living Diet recommendation:  Discharge Diet Orders (From admission, onward)     Start     Ordered   04/25/23 0000  Diet - low sodium heart healthy        04/25/23 1400           Cardiac diet DISCHARGE MEDICATION: Allergies as of 04/25/2023       Reactions   Celebrex [celecoxib]    Bactrim  [sulfamethoxazole-trimethoprim]    Doxycycline    Penicillins         Medication List     STOP taking these medications    amLODipine 2.5 MG tablet Commonly known as: NORVASC       TAKE these medications    alendronate 70 MG tablet Commonly known as: FOSAMAX Take 70 mg by mouth once a week.   apixaban 2.5 MG Tabs tablet Commonly known as: ELIQUIS Take 1 tablet (2.5 mg total) by mouth 2 (two) times daily.   Biotin 2500 MCG Chew Chew 1 tablet by mouth daily.   CITRACAL + D PO Take 1 tablet by mouth daily.   diltiazem 120 MG 24 hr capsule Commonly known as: CARDIZEM CD Take 1 capsule (120 mg total) by mouth daily. Start taking on: April 26, 2023   feeding supplement (GLUCERNA SHAKE) Liqd Take 237 mLs by mouth 3 (three) times daily between meals.   fluticasone 50 MCG/ACT nasal spray Commonly known as: FLONASE Place 1 spray into both nostrils daily.   pantoprazole 20 MG tablet Commonly known as: PROTONIX Take 20 mg by mouth daily.   raloxifene 60 MG tablet Commonly known as: EVISTA Take 60 mg by mouth daily.   rosuvastatin 20 MG tablet Commonly known as: CRESTOR Take 1 tablet (20 mg total) by mouth daily. Start taking on: April 26, 2023   senna 8.6 MG tablet Commonly known as: SENOKOT Take 1 tablet by mouth 2 (two) times daily.   Vitamin D3 50 MCG (2000 UT) capsule  Take 2,000 Units by mouth daily.        Follow-up Information     Ellan Lambert, NP. Schedule an appointment as soon as possible for a visit in 1 week(s).   Specialty: Adult Health Nurse Practitioner Contact information: [address] [place] [address] [phone]         End, Connie Deer, MD. Schedule an appointment as soon as possible for a visit in 1 week(s).   Specialty: Cardiology Contact information: [address] [place] [address] [phone]                Discharge Exam: Ceasar Mons Weights   04/22/23 0433   Weight: 54 kg   General.  Frail elderly lady, in no acute distress. Pulmonary.  Lungs clear bilaterally, normal respiratory effort. CV.  Regular rate and rhythm, no JVD, rub or murmur. Abdomen.  Soft, nontender, nondistended, BS positive. CNS.  Alert and oriented .  No focal neurologic deficit. Extremities.  No edema, no cyanosis, pulses intact and symmetrical. Psychiatry.  Judgment and insight appears normal.   Condition at discharge: stable  The results of significant diagnostics from this hospitalization (including imaging, microbiology, ancillary and laboratory) are listed below for reference.   Imaging Studies: US THYROID  Result Date: 04/25/2023 CLINICAL DATA:  Hyperthyroid. EXAM: THYROID ULTRASOUND TECHNIQUE: Ultrasound examination of the thyroid gland and adjacent soft tissues was performed. COMPARISON:  None Available. FINDINGS: Parenchymal Echotexture: Moderately heterogenous Isthmus: 0.4 cm Right lobe: 4.2 x 1.7 x 1.4 cm Left lobe: 3.7 x 1.7 x 1.2 cm _________________________________________________________ Estimated total number of nodules >/= 1 cm: 0 Number of spongiform nodules >/=  2 cm not described below (TR1): 0 Number of mixed cystic and solid nodules >/= 1.5 cm not described below (TR2): 0 _________________________________________________________ Normal vascularity on color Doppler imaging. Incidental note is made of a small subcentimeter nodule in the left upper gland. This lesion does not meet criteria to warrant biopsy or imaging surveillance. No further imaging follow-up recommended. IMPRESSION: Moderately heterogeneous thyroid gland without hypervascularity or thyroid nodule that would warrant further evaluation. No imaging follow-up recommended. The above is in keeping with the ACR TI-RADS recommendations - J Am Coll Radiol 2017;14:587-595. Electronically Signed   By: Malachy Moan M.D.   On: 04/25/2023 06:55   ECHOCARDIOGRAM COMPLETE  Result Date: 04/23/2023     ECHOCARDIOGRAM REPORT   Patient Name:   Connie Schwartz Date of Exam: 04/23/2023 Medical Rec #:  [id number]    Height:       62.5 in Accession #:    [id number]   Weight:       119.0 lb Date of Birth:  Jan 06, 1929     BSA:          1.543 m Patient Age:    89 years     BP:           121/55 mmHg Patient Gender: F            HR:           92 bpm. Exam Location:  [facility] Procedure: 2D Echo, Cardiac Doppler and Color Doppler Indications:     Atrial Fibrillation  History:         Patient has prior history of Echocardiogram examinations, most                  recent 12/27/2022. Arrythmias:Atrial Fibrillation,  Signs/Symptoms:Shortness of Breath; Risk Factors:Hypertension                  and Non-Smoker.  Sonographer:     Mikki Harbor Referring Phys:  [id number] Iba Diagnosing Phys: Julien Nordmann MD IMPRESSIONS  1. Left ventricular ejection fraction, by estimation, is 60 to 65%. The left ventricle has normal function. The left ventricle has no regional wall motion abnormalities. There is mild left ventricular hypertrophy. Left ventricular diastolic parameters are consistent with Grade I diastolic dysfunction (impaired relaxation).  2. Right ventricular systolic function is normal. The right ventricular size is normal. Mildly increased right ventricular wall thickness. There is normal pulmonary artery systolic pressure. The estimated right ventricular systolic pressure is 34.6 mmHg.  3. The mitral valve is normal in structure. Moderate mitral valve regurgitation. No evidence of mitral stenosis.  4. Tricuspid valve regurgitation is moderate.  5. The aortic valve is tricuspid. Aortic valve regurgitation is mild. No aortic stenosis is present.  6. There is borderline dilatation of the aortic root, measuring 37 mm.  7. The inferior vena cava is normal in size with greater than 50% respiratory variability, suggesting right atrial pressure of 3 mmHg. FINDINGS  Left Ventricle: Left ventricular ejection fraction, by  estimation, is 60 to 65%. The left ventricle has normal function. The left ventricle has no regional wall motion abnormalities. The left ventricular internal cavity size was normal in size. There is  mild left ventricular hypertrophy. Left ventricular diastolic parameters are consistent with Grade I diastolic dysfunction (impaired relaxation). Right Ventricle: The right ventricular size is normal. Mildly increased right ventricular wall thickness. Right ventricular systolic function is normal. There is normal pulmonary artery systolic pressure. The tricuspid regurgitant velocity is 2.81 m/s, and with an assumed right atrial pressure of 3 mmHg, the estimated right ventricular systolic pressure is 34.6 mmHg. Left Atrium: Left atrial size was normal in size. Right Atrium: Right atrial size was normal in size. Pericardium: There is no evidence of pericardial effusion. Mitral Valve: The mitral valve is normal in structure. Moderate mitral valve regurgitation. No evidence of mitral valve stenosis. MV peak gradient, 2.5 mmHg. The mean mitral valve gradient is 1.0 mmHg. Tricuspid Valve: The tricuspid valve is normal in structure. Tricuspid valve regurgitation is moderate . No evidence of tricuspid stenosis. Aortic Valve: The aortic valve is tricuspid. Aortic valve regurgitation is mild. No aortic stenosis is present. Aortic valve mean gradient measures 3.0 mmHg. Aortic valve peak gradient measures 6.0 mmHg. Aortic valve area, by VTI measures 2.88 cm. Pulmonic Valve: The pulmonic valve was normal in structure. Pulmonic valve regurgitation is mild. No evidence of pulmonic stenosis. Aorta: The aortic root is normal in size and structure. There is borderline dilatation of the aortic root, measuring 37 mm. Venous: The inferior vena cava is normal in size with greater than 50% respiratory variability, suggesting right atrial pressure of 3 mmHg. IAS/Shunts: No atrial level shunt detected by color flow Doppler.  LEFT VENTRICLE  PLAX 2D LVIDd:         3.70 cm   Diastology LVIDs:         2.40 cm   LV e' medial:    5.87 cm/s LV PW:         1.30 cm   LV E/e' medial:  10.3 LV IVS:        1.10 cm   LV e' lateral:   7.07 cm/s LVOT diam:     1.90 cm   LV E/e' lateral:  8.5 LV SV:         61 LV SV Index:   40 LVOT Area:     2.84 cm  RIGHT VENTRICLE RV Basal diam:  3.15 cm RV Mid diam:    2.80 cm RV S prime:     19.80 cm/s LEFT ATRIUM             Index        RIGHT ATRIUM           Index LA diam:        2.90 cm 1.88 cm/m   RA Area:     14.40 cm LA Vol (A2C):   26.7 ml 17.31 ml/m  RA Volume:   36.40 ml  23.60 ml/m LA Vol (A4C):   30.9 ml 20.03 ml/m LA Biplane Vol: 28.9 ml 18.73 ml/m  AORTIC VALVE                    PULMONIC VALVE AV Area (Vmax):    2.70 cm     PV Vmax:       1.04 m/s AV Area (Vmean):   2.38 cm     PV Peak grad:  4.3 mmHg AV Area (VTI):     2.88 cm AV Vmax:           122.00 cm/s AV Vmean:          79.000 cm/s AV VTI:            0.212 m AV Peak Grad:      6.0 mmHg AV Mean Grad:      3.0 mmHg LVOT Vmax:         116.00 cm/s LVOT Vmean:        66.400 cm/s LVOT VTI:          0.215 m LVOT/AV VTI ratio: 1.01  AORTA Ao Root diam: 3.70 cm Ao Asc diam:  3.50 cm MITRAL VALVE               TRICUSPID VALVE MV Area (PHT): 3.10 cm    TR Peak grad:   31.6 mmHg MV Area VTI:   3.66 cm    TR Vmax:        281.00 cm/s MV Peak grad:  2.5 mmHg MV Mean grad:  1.0 mmHg    SHUNTS MV Vmax:       0.78 m/s    Systemic VTI:  0.22 m MV Vmean:      46.7 cm/s   Systemic Diam: 1.90 cm MV Decel Time: 245 msec MV E velocity: 60.30 cm/s MV A velocity: 81.70 cm/s MV E/A ratio:  0.74 Julien Nordmann MD Electronically signed by Julien Nordmann MD Signature Date/Time: 04/23/2023/6:11:52 PM    Final    DG Hips Bilat W or Wo Pelvis 3-4 Views  Result Date: 04/22/2023 CLINICAL DATA:  Fall. EXAM: DG HIP (WITH OR WITHOUT PELVIS) 4V BILAT COMPARISON:  None Available. FINDINGS: No evidence of hip fracture or dislocation. No evidence of pelvic ring fracture or  diastasis. 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 Subjective generalized osteopenia. IMPRESSION: No acute finding. Electronically Signed   By: Tiburcio Pea M.D.   On: 04/22/2023 06:48   DG Lumbar Spine Complete  Result Date: 04/22/2023 CLINICAL DATA:  Fall.  Back pain EXAM: LUMBAR SPINE - COMPLETE 4 VIEW COMPARISON:  None Available. FINDINGS: Transitional lumbosacral vertebra numbered L5 based on the lowest ribs. L2 compression fracture with superior endplate depression that is chronic appearing. Height loss is  mild. Degeneration especially affecting facets with L4-5 anterolisthesis. Osteopenia and atherosclerosis. IMPRESSION: 1. Chronic L2 compression fracture. 2. Lumbar spine degeneration with L4-5 anterolisthesis. Electronically Signed   By: Tiburcio Pea M.D.   On: 04/22/2023 06:47   DG Thoracic Spine 2 View  Result Date: 04/22/2023 CLINICAL DATA:  Fall with back pain EXAM: THORACIC SPINE 2 VIEWS COMPARISON:  09/15/2020 chest radiograph FINDINGS: Midthoracic vertebral body wedging which is new, likely T7 or T8. No indication of retropulsion or listhesis. Subjective generalized osteopenia. Maintained posterior mediastinal fat planes. IMPRESSION: Midthoracic compression fracture which is new from 2022 and age indeterminate. Height loss is mild. Electronically Signed   By: Tiburcio Pea M.D.   On: 04/22/2023 06:46   DG Chest Portable 1 View  Result Date: 04/22/2023 CLINICAL DATA:  Dizziness.  Fall. EXAM: PORTABLE CHEST 1 VIEW COMPARISON:  09/15/2020 FINDINGS: Lungs are hyperexpanded. The lungs are clear without focal pneumonia, edema, pneumothorax or pleural effusion. Interstitial markings are diffusely coarsened with chronic features. Skin fold noted over the right apex. The cardiopericardial silhouette is within normal limits for size. Telemetry leads overlie the chest. Bones are diffusely demineralized. IMPRESSION: Hyperexpansion with chronic interstitial coarsening. No acute cardiopulmonary  findings. Electronically Signed   By: Kennith Center M.D.   On: 04/22/2023 06:08    Microbiology: Results for orders placed or performed during the hospital encounter of 09/15/20  Resp Panel by RT-PCR (Flu A&B, Covid) Nasopharyngeal Swab     Status: None   Collection Time: 09/15/20 10:05 AM   Specimen: Nasopharyngeal Swab; Nasopharyngeal(NP) swabs in vial transport medium  Result Value Ref Range Status   SARS Coronavirus 2 by RT PCR NEGATIVE NEGATIVE Final    Comment: (NOTE) SARS-CoV-2 target nucleic acids are NOT DETECTED.  The SARS-CoV-2 RNA is generally detectable in upper respiratory specimens during the acute phase of infection. The lowest concentration of SARS-CoV-2 viral copies this assay can detect is 138 copies/mL. A negative result does not preclude SARS-Cov-2 infection and should not be used as the sole basis for treatment or other patient management decisions. A negative result may occur with  improper specimen collection/handling, submission of specimen other than nasopharyngeal swab, presence of viral mutation(s) within the areas targeted by this assay, and inadequate number of viral copies(<138 copies/mL). A negative result must be combined with clinical observations, patient history, and epidemiological information. The expected result is Negative.  Fact Sheet for Patients:  BloggerCourse.com  Fact Sheet for Healthcare Providers:  SeriousBroker.it  This test is no t yet approved or cleared by the [place] FDA and  has been authorized for detection and/or diagnosis of SARS-CoV-2 by FDA under an Emergency Use Authorization (EUA). This EUA will remain  in effect (meaning this test can be used) for the duration of the COVID-19 declaration under Section 564(b)(1) of the Act, 21 U.S.C.section 360bbb-3(b)(1), unless the authorization is terminated  or revoked sooner.       Influenza A by PCR NEGATIVE NEGATIVE Final    Influenza B by PCR NEGATIVE NEGATIVE Final    Comment: (NOTE) The Xpert Xpress SARS-CoV-2/FLU/RSV plus assay is intended as an aid in the diagnosis of influenza from Nasopharyngeal swab specimens and should not be used as a sole basis for treatment. Nasal washings and aspirates are unacceptable for Xpert Xpress SARS-CoV-2/FLU/RSV testing.  Fact Sheet for Patients: BloggerCourse.com  Fact Sheet for Healthcare Providers: SeriousBroker.it  This test is not yet approved or cleared by the [place] FDA and has been authorized for detection and/or  diagnosis of SARS-CoV-2 by FDA under an Emergency Use Authorization (EUA). This EUA will remain in effect (meaning this test can be used) for the duration of the COVID-19 declaration under Section 564(b)(1) of the Act, 21 U.S.C. section 360bbb-3(b)(1), unless the authorization is terminated or revoked.  Performed at [facility], [address] [place], [place] [address]     Labs: CBC: Recent Labs  Lab 04/22/23 0413 04/23/23 0515 04/24/23 0430 04/25/23 0512  WBC 7.0 8.0 13.1* 10.2  HGB 12.0 11.7* 11.8* 11.7*  HCT 36.4 34.8* 34.3* 34.7*  MCV 89.9 87.4 86.4 87.4  PLT 236 237 235 231   Basic Metabolic Panel: Recent Labs  Lab 04/22/23 0413 04/23/23 0515 04/24/23 0430 04/25/23 0512  NA 136 134* 137 135  K 3.5 3.6 4.1 4.1  CL 102 103 107 103  CO2 21* 24 20* 23  GLUCOSE 119* 105* 110* 112*  BUN 13 14 19  27*  CREATININE 0.92 0.79 0.91 0.90  CALCIUM 8.7* 8.2* 8.6* 8.6*  MG 1.9 2.0 2.0 2.2  PHOS  --  3.5 2.9 3.7   Liver Function Tests: No results for input(s): "AST", "ALT", "ALKPHOS", "BILITOT", "PROT", "ALBUMIN" in the last 168 hours. CBG: Recent Labs  Lab 04/22/23 0424  GLUCAP 100*    Discharge time spent: greater than 30 minutes.  This record has been created using Conservation officer, historic buildings. Errors have been sought and corrected,but may not  always be located. Such creation errors do not reflect on the standard of care.   Signed: Arnetha Courser, MD [facility] 04/25/2023

## 2023-04-25 NOTE — [place] FL2 (Signed)
[place] MEDICAID FL2 LEVEL OF CARE FORM     IDENTIFICATION  Patient Name: Connie Schwartz Birthdate: [birth year] Sex: female Admission Date (Current Location): 04/22/2023  [facility] and [place] Number:  Chiropodist and Address:  [facility], [address] [place], [place] [address]      Provider Number: [id number]  Attending Physician Name and Address:  Arnetha Courser, MD  Relative Name and Phone Number:  Beverlee Nims (Niece)  [phone] [facility])    Current Level of Care: Hospital Recommended Level of Care: Assisted Living Facility Prior Approval Number:    Date Approved/Denied:   PASRR Number:    Discharge Plan: Home    Current Diagnoses: Patient Active Problem List   Diagnosis Date Noted   Fall 04/23/2023   Demand ischemia (HCC) 04/23/2023   Atrial fibrillation with rapid ventricular response (HCC) 04/22/2023   Osteoporosis 04/22/2023   Thoracic compression fracture (HCC) 04/22/2023   Atrial fibrillation with RVR (HCC) 04/22/2023   Shortness of breath 12/26/2022   Abnormal echocardiogram 12/26/2022   Essential hypertension 12/26/2022   Irritable colon 01/12/2021   Esophageal reflux 01/12/2021   Closed fracture of cervical vertebra (HCC) 09/18/2020   Compression fracture of T1 vertebra (HCC) 09/18/2020    Orientation RESPIRATION BLADDER Height & Weight     Self, Time, Situation, Place  Normal Continent Weight: 54 kg Height:  5' 2.5" (158.8 cm)  BEHAVIORAL SYMPTOMS/MOOD NEUROLOGICAL BOWEL NUTRITION STATUS  Other (Comment) (n/a)  (n/a) Continent Diet  AMBULATORY STATUS COMMUNICATION OF NEEDS Skin   Limited Assist Verbally Normal                       Personal Care Assistance Level of Assistance              Functional Limitations Info             SPECIAL CARE FACTORS FREQUENCY  PT (By licensed PT), OT (By licensed OT)     PT Frequency: Min 2x weekly OT Frequency: Min 2x weekly             Contractures Contractures Info: Not present    Additional Factors Info  Code Status, Allergies Code Status Info: DNR Allergies Info: Celebrex (Celecoxib), Bactrim (Sulfamethoxazole-trimethoprim), Doxycycline, Penicillins           Current Medications (04/25/2023):   alendronate 70 MG tablet Commonly known as: FOSAMAX Take 70 mg by mouth once a week.    apixaban 2.5 MG Tabs tablet Commonly known as: ELIQUIS Take 1 tablet (2.5 mg total) by mouth 2 (two) times daily.    Biotin 2500 MCG Chew Chew 1 tablet by mouth daily.    CITRACAL + D PO Take 1 tablet by mouth daily.    diltiazem 120 MG 24 hr capsule Commonly known as: CARDIZEM CD Take 1 capsule (120 mg total) by mouth daily. Start taking on: April 26, 2023    feeding supplement (GLUCERNA SHAKE) Liqd Take 237 mLs by mouth 3 (three) times daily between meals.    fluticasone 50 MCG/ACT nasal spray Commonly known as: FLONASE Place 1 spray into both nostrils daily.    pantoprazole 20 MG tablet Commonly known as: PROTONIX Take 20 mg by mouth daily.    raloxifene 60 MG tablet Commonly known as: EVISTA Take 60 mg by mouth daily.    rosuvastatin 20 MG tablet Commonly known as: CRESTOR Take 1 tablet (20 mg total) by mouth daily. Start taking on: April 26, 2023  senna 8.6 MG tablet Commonly known as: SENOKOT Take 1 tablet by mouth 2 (two) times daily.    Vitamin D3 50 MCG (2000 UT) capsule Take 2,000 Units by mouth daily.     Discharge Medications: Please see discharge summary for a list of discharge medications.  Relevant Imaging Results:  Relevant Lab Results:   Additional Information SSN# [id number]  Truddie Hidden, RN

## 2023-04-25 NOTE — Plan of Care (Signed)

## 2023-04-25 NOTE — Progress Notes (Signed)
Occupational Therapy Treatment Patient Details Name: Connie Schwartz MRN: [id number] DOB: [birth year] Today's Date: 04/25/2023   History of present illness Pt is a 89 y.o. female with PMH of HTN, osteoporosis, and compression fractures, presented at [facility] ED on 04/22/23 s/p fall due to dizziness. Pt was having some backache, denied any palpitations or chest pain.  Pt was found to have new onset A-fib with RVR.   OT comments  Pt is supine in bed on arrival. Easily arousable and agreeable to OT session. She reports 2-3/10 pain in her back described as soreness that did not increase with mobility. Pt performed bed mobility with SUP, STS and step pivot transfer to [facility] ~3-4 feet using RW with SBA/SUP. Pt demo UB dressing with set up assist on BSC. Anterior hygiene with SUP via lateral leans. Vitals monitored and remained WFL. HR up to 122 following mobility trial in room using RW with SBA for line management able to walk distance of 40 feet. HR decreased to 90 at rest and would increase to 96 during pt conversation.  Pt returned to recliner with all needs in place and will cont to require skilled acute OT services to maximize his safety and IND to return to PLOF.       If plan is discharge home, recommend the following:  A little help with walking and/or transfers;A little help with bathing/dressing/bathroom;Help with stairs or ramp for entrance;Assistance with cooking/housework   Equipment Recommendations  None recommended by OT    Recommendations for Other Services      Precautions / Restrictions Precautions Precautions: Fall Restrictions Weight Bearing Restrictions: No       Mobility Bed Mobility Overal bed mobility: Needs Assistance Bed Mobility: Supine to Sit     Supine to sit: Supervision, HOB elevated, Used rails          Transfers Overall transfer level: Needs assistance Equipment used: Rolling walker (2 wheels) Transfers: Sit to/from Stand Sit to Stand: Supervision            General transfer comment: SBA for STS and 40 feet mobility in room with RW, OT present for line management     Balance Overall balance assessment: Needs assistance Sitting-balance support: Feet supported Sitting balance-Leahy Scale: Good     Standing balance support: Bilateral upper extremity supported, During functional activity Standing balance-Leahy Scale: Good Standing balance comment: no LOB while ambulating with RW in room                           ADL either performed or assessed with clinical judgement   ADL Overall ADL's : Needs assistance/impaired                 Upper Body Dressing : Set up       Toilet Transfer: BSC/3in1;Ambulation;Supervision/safety;Contact guard assist;Rolling walker (2 wheels) Toilet Transfer Details (indicate cue type and reason): SBA for mobility to BSC 3-4 feet with RW Toileting- Clothing Manipulation and Hygiene: Supervision/safety;Sitting/lateral lean       Functional mobility during ADLs: Supervision/safety General ADL Comments: line management needed during mobility in room ~40 feet    Extremity/Trunk Assessment Upper Extremity Assessment Upper Extremity Assessment: Overall WFL for tasks assessed   Lower Extremity Assessment Lower Extremity Assessment: Generalized weakness        Vision       Perception     Praxis      Cognition Arousal: Alert Behavior During Therapy: WFL for tasks assessed/performed Overall  Cognitive Status: Within Functional Limits for tasks assessed                                 General Comments: AO x4; pleasant and cooperative with PT slightly talkative; possible STM deficits as pt noted to repeat herself during today's session        Exercises Other Exercises Other Exercises: educated on PLB following activity to assist in lowering HR.    Shoulder Instructions       General Comments HR up to 122 at highest following/during 40 feet mobility, came back  down to 90 RR in chair, goes up when pt talks    Pertinent Vitals/ Pain       Pain Assessment Pain Score: 3  Pain Location: back Pain Descriptors / Indicators: Sore Pain Intervention(s): Monitored during session  Home Living                                          Prior Functioning/Environment              Frequency  Min 1X/week        Progress Toward Goals  OT Goals(current goals can now be found in the care plan section)  Progress towards OT goals: Progressing toward goals  Acute Rehab OT Goals Patient Stated Goal: return to ALF OT Goal Formulation: With patient Time For Goal Achievement: 05/08/23 Potential to Achieve Goals: Good  Plan      Co-evaluation                 AM-PAC OT "6 Clicks" Daily Activity     Outcome Measure   Help from another person eating meals?: None Help from another person taking care of personal grooming?: None Help from another person toileting, which includes using toliet, bedpan, or urinal?: A Little Help from another person bathing (including washing, rinsing, drying)?: A Little Help from another person to put on and taking off regular upper body clothing?: None Help from another person to put on and taking off regular lower body clothing?: A Little 6 Click Score: 21    End of Session Equipment Utilized During Treatment: Rolling walker (2 wheels)  OT Visit Diagnosis: History of falling (Z91.81);Muscle weakness (generalized) (M62.81)   Activity Tolerance Patient tolerated treatment well   Patient Left in chair;with call bell/phone within reach;with chair alarm set   Nurse Communication          Time: [phone] OT Time Calculation (min): 33 min  Charges: OT General Charges $OT Visit: 1 Visit OT Treatments $Self Care/Home Management : 8-22 mins $Therapeutic Activity: 8-22 mins  Connie Schwartz, OTR/L  04/25/23, 10:28 AM  Connie Schwartz 04/25/2023, 10:25 AM

## 2023-04-25 NOTE — Evaluation (Signed)
Clinical/Bedside Swallow Evaluation Patient Details  Name: Connie Schwartz MRN: [id number] Date of Birth: April 23, 1929  Today's Date: 04/25/2023 Time: SLP Start Time (ACUTE ONLY): 0802 SLP Stop Time (ACUTE ONLY): 0840 SLP Time Calculation (min) (ACUTE ONLY): 38 min  Past Medical History:  Past Medical History:  Diagnosis Date   GERD (gastroesophageal reflux disease)    Hypertension    Osteoporosis    Past Surgical History:  Past Surgical History:  Procedure Laterality Date   CATARACT EXTRACTION Bilateral    HPI:  89 y.o. female with PMHx: of HTN, osteoporosis, GERD, and compression fractures, presented s/p fall due to dizziness with a backache. Pt found to have new onset A-fib with RVR. Cardiology following. CXR on admit, "Hyperexpansion with chronic interstitial coarsening. No acute  cardiopulmonary findings. "    Assessment / Plan / Recommendation  Clinical Impression  Pt seen for clinical swallowing evaluation. Pt demonstrated an intact oral swallow. Pharyngeal swallow appeared [facility]. Concern for esophageal component of pt's dysphagia given observed belching, hx of GERD, and c/o globus sensation (intermittently with pills, seldomly with solids). Globus sensation was not reproduced on today's evaluation. Recommend continuation of a regular diet with thin liquids. Moistened solids. Standard aspiration and reflux precuations. Meds whole vs crushed in puree as able. SLP to sign off as pt has no acute SLP needs at this time. SLP Visit Diagnosis: Dysphagia, pharyngoesophageal phase (R13.14)    Aspiration Risk       Diet Recommendation Regular;Thin liquid    Liquid Administration via: Spoon;Cup;Straw Medication Administration: Whole meds with puree (vs crushed) Supervision: Patient able to self feed Compensations: Slow rate;Small sips/bites;Follow solids with liquid Postural Changes: Seated upright at 90 degrees (upright 60-90 minutes after POs)    Other  Recommendations Oral Care  Recommendations: Oral care BID    Recommendations for follow up therapy are one component of a multi-disciplinary discharge planning process, led by the attending physician.  Recommendations may be updated based on patient status, additional functional criteria and insurance authorization.  Follow up Recommendations No SLP follow up         Functional Status Assessment Patient has not had a recent decline in their functional status         Prognosis Prognosis for improved oropharyngeal function: Good      Swallow Study   General Date of Onset: 04/22/23 (admit date) HPI: 89 y.o. female with PMHx: of HTN, osteoporosis, GERD, and compression fractures, presented s/p fall due to dizziness with a backache. Pt found to have new onset A-fib with RVR. Cardiology following. CXR on admit, "Hyperexpansion with chronic interstitial coarsening. No acute  cardiopulmonary findings. " Type of Study: Bedside Swallow Evaluation Previous Swallow Assessment: none Diet Prior to this Study: Regular;Thin liquids (Level 0) Temperature Spikes Noted: Yes Respiratory Status: Room air History of Recent Intubation: No Behavior/Cognition: Alert;Cooperative;Pleasant mood Oral Cavity Assessment: Within Functional Limits Oral Care Completed by SLP: Yes Oral Cavity - Dentition: Adequate natural dentition Vision: Functional for self-feeding Self-Feeding Abilities: Able to feed self Patient Positioning: Upright in bed Baseline Vocal Quality: Normal;Hoarse (mildly hoarse; functional) Volitional Cough: Strong Volitional Swallow: Able to elicit    Oral/Motor/Sensory Function WFL  Ice Chips Ice chips: Not tested   Thin Liquid Thin Liquid: Within functional limits Presentation: Cup;Straw    Nectar Thick Nectar Thick Liquid: Not tested   Honey Thick Honey Thick Liquid: Not tested   Puree Puree: Within functional limits Presentation: Self Fed   Solid     Solid: Within functional limits  Presentation: Self Fed      Clyde Canterbury, M.S., CCC-SLP Speech-Language Pathologist [place] [facility] [phone] (ASCOM)  Alessandra Bevels Jobe Mutch 04/25/2023,9:00 AM

## 2023-04-25 NOTE — TOC Transition Note (Signed)
Transition of Care [facility]) - Progression Note    Patient Details  Name: Connie Schwartz MRN: [id number] Date of Birth: July 23, 1928  Transition of Care [facility]) CM/SW Contact  Truddie Hidden, RN Phone Number: 04/25/2023, 3:34 PM  Clinical Narrative:    10:30am Attempt to reach Ed Weeks at [facility] no answer. Left a message.   3:00pm Attempt to reach ed Weeks at [facility]. No answer. Spoke with representative Maxine Glenn she was advised patient is being discharged. FL2, and discharge summary requested to be faxed to [phone].  Spoke with patient to advised of discharge. She stated she was very grateful for the care she has received. Patient stated her niece will come to pick her up and to call her.   RNCM contacted Belenda Cruise, patient's niece. She is on her way back to transport the patient to [facility]  3:25pm FL2 and discharge summary faxed to [phone]         Expected Discharge Plan and Services         Expected Discharge Date: 04/25/23                                     Social Determinants of Health (SDOH) Interventions SDOH Screenings   Food Insecurity: Patient Unable To Answer (04/22/2023)  Housing: Patient Unable To Answer (04/22/2023)  Transportation Needs: Patient Unable To Answer (04/22/2023)  Utilities: Patient Unable To Answer (04/22/2023)  Tobacco Use: Low Risk  (04/22/2023)    Readmission Risk Interventions     No data to display

## 2023-04-25 NOTE — TOC Progression Note (Signed)
Transition of Care [facility]) - Progression Note    Patient Details  Name: Connie Schwartz MRN: [id number] Date of Birth: July 09, 1928  Transition of Care [facility]) CM/SW Contact  Truddie Hidden, RN Phone Number: 04/25/2023, 12:06 PM  Clinical Narrative:    Message left for Ed Weeks at [facility].         Expected Discharge Plan and Services                                               Social Determinants of Health (SDOH) Interventions SDOH Screenings   Food Insecurity: Patient Unable To Answer (04/22/2023)  Housing: Patient Unable To Answer (04/22/2023)  Transportation Needs: Patient Unable To Answer (04/22/2023)  Utilities: Patient Unable To Answer (04/22/2023)  Tobacco Use: Low Risk  (04/22/2023)    Readmission Risk Interventions     No data to display

## 2023-04-25 NOTE — Care Management Important Message (Signed)
Important Message  Patient Details  Name: Connie Schwartz MRN: [id number] Date of Birth: 04/17/1929   Important Message Given:  Yes - Medicare IM     Manasseh Pittsley, Stephan Minister 04/25/2023, 2:04 PM

## 2023-04-25 NOTE — Progress Notes (Signed)
Physical Therapy Treatment Patient Details Name: Connie Schwartz MRN: [id number] DOB: 08-30-1928 Today's Date: 04/25/2023   History of Present Illness Pt is a 89 y.o. female with PMH of HTN, osteoporosis, and compression fractures, presented at [facility] ED on 04/22/23 s/p fall due to dizziness. Pt was having some backache, denied any palpitations or chest pain.  Pt was found to have new onset A-fib with RVR.    PT Comments  Pt is received in bed with niece at bedside, she is agreeable to PT session. VSS monitored throughout session with HR ranging 97-110bpm. Pt performs bed mobility and transfers close supA for safety. Pt requested to defer amb activity today due to already walking. Pt able to perform standing BLE exercises (marches, heel raises, mini squats) with and without AD to promote BLE strength and balance. Educated Pt and family on use of AD during all standing activities and asking for assist from ALF staff for IADLs to reduce risk of falls-both verbalized understanding. Overall, Pt demonstrates progress towards PT goals and would benefit from skilled PT to address above deficits and promote optimal return to PLOF.   If plan is discharge home, recommend the following: A little help with walking and/or transfers;A little help with bathing/dressing/bathroom;Assistance with cooking/housework;Direct supervision/assist for medications management;Assist for transportation   Can travel by private vehicle        Equipment Recommendations  Rolling walker (2 wheels)    Recommendations for Other Services       Precautions / Restrictions Precautions Precautions: Fall Restrictions Weight Bearing Restrictions: No     Mobility  Bed Mobility Overal bed mobility: Needs Assistance Bed Mobility: Supine to Sit, Sit to Supine     Supine to sit: Supervision, HOB elevated, Used rails Sit to supine: Supervision, Used rails   General bed mobility comments: able to perform bed mobility close supA for  safety; tactile cuing required to initiate bed mobility    Transfers Overall transfer level: Needs assistance Equipment used: Rolling walker (2 wheels) Transfers: Sit to/from Stand Sit to Stand: Supervision           General transfer comment: SBA for STS with verbal cuing for hand placement    Ambulation/Gait               General Gait Details: Deferred this session per Pt's request   Stairs             Wheelchair Mobility     Tilt Bed    Modified Rankin (Stroke Patients Only)       Balance Overall balance assessment: Needs assistance Sitting-balance support: Feet supported Sitting balance-Leahy Scale: Good Sitting balance - Comments: able to maintain seated EOB during functional activities   Standing balance support: During functional activity, No upper extremity supported Standing balance-Leahy Scale: Good Standing balance comment: no LOB noted during standing BLE exercises                            Cognition Arousal: Alert Behavior During Therapy: WFL for tasks assessed/performed Overall Cognitive Status: Within Functional Limits for tasks assessed                                 General Comments: Pleasant and motivated to get stronger with PT        Exercises Other Exercises Other Exercises: x10 alt standing marches using RW, x10 standing heel raises using RW,  x10 standing mini squats using RW; x10 alt standing marches without AD, x10 standing heel raises without AD, x10 mini squats without AD; CGA for all exercises    General Comments General comments (skin integrity, edema, etc.): HR maintained in 97-110 bpm during session      Pertinent Vitals/Pain Pain Assessment Pain Assessment: No/denies pain    Home Living                          Prior Function            PT Goals (current goals can now be found in the care plan section) Acute Rehab PT Goals Patient Stated Goal: to go back to [employer] PT Goal Formulation: With patient Time For Goal Achievement: 05/08/23 Potential to Achieve Goals: Good Progress towards PT goals: Progressing toward goals    Frequency    Min 1X/week      PT Plan      Co-evaluation              AM-PAC PT "6 Clicks" Mobility   Outcome Measure  Help needed turning from your back to your side while in a flat bed without using bedrails?: None Help needed moving from lying on your back to sitting on the side of a flat bed without using bedrails?: None Help needed moving to and from a bed to a chair (including a wheelchair)?: None Help needed standing up from a chair using your arms (e.g., wheelchair or bedside chair)?: None Help needed to walk in hospital room?: A Little Help needed climbing 3-5 steps with a railing? : A Lot 6 Click Score: 21    End of Session   Activity Tolerance: Patient tolerated treatment well Patient left: in bed;with call bell/phone within reach;with family/visitor present;with bed alarm set Nurse Communication: Mobility status PT Visit Diagnosis: Unsteadiness on feet (R26.81);Muscle weakness (generalized) (M62.81);History of falling (Z91.81);Dizziness and giddiness (R42)     Time: 1610-9604 PT Time Calculation (min) (ACUTE ONLY): 17 min  Charges:                            Elmon Else, SPT    Revella Shelton 04/25/2023, 3:26 PM

## 2023-04-25 NOTE — Plan of Care (Signed)
[facility] Tonsillectomy/Adenoidectomy, Postoperative PEDS care plan entered in error.

## 2023-04-29 ENCOUNTER — Ambulatory Visit: Payer: Medicare Other | Attending: Cardiology | Admitting: Cardiology

## 2023-04-29 ENCOUNTER — Encounter: Payer: Self-pay | Admitting: Cardiology

## 2023-04-29 VITALS — BP 124/70 | HR 94 | Ht 63.5 in | Wt 117.0 lb

## 2023-04-29 DIAGNOSIS — S22000S Wedge compression fracture of unspecified thoracic vertebra, sequela: Secondary | ICD-10-CM | POA: Diagnosis present

## 2023-04-29 DIAGNOSIS — E782 Mixed hyperlipidemia: Secondary | ICD-10-CM | POA: Insufficient documentation

## 2023-04-29 DIAGNOSIS — I480 Paroxysmal atrial fibrillation: Secondary | ICD-10-CM | POA: Insufficient documentation

## 2023-04-29 DIAGNOSIS — S22000D Wedge compression fracture of unspecified thoracic vertebra, subsequent encounter for fracture with routine healing: Secondary | ICD-10-CM | POA: Diagnosis not present

## 2023-04-29 DIAGNOSIS — I10 Essential (primary) hypertension: Secondary | ICD-10-CM | POA: Insufficient documentation

## 2023-04-29 NOTE — Progress Notes (Signed)
Cardiology Office Note:  .   Date:  04/29/2023  ID:  Connie Schwartz, DOB [birth year], MRN [id number] PCP: Ellan Lambert, NP  [facility] Providers Cardiologist:  Yvonne Kendall, MD    History of Present Illness: .   Connie Schwartz is a 89 y.o. female with past medical history of hypertension, GERD, osteoporosis complicated by multiple compression fractures in the setting of mechanical falls, hypercholesterolemia, who is here today for follow-up after recent hospitalization for new onset atrial fibrillation.  History of fall in 2022 where she was send overall no continue and was admitted.  She fell and hit her head.  CT scans were negative but she was found to have compression fracture of T1.  Workup was otherwise unremarkable.  After that she was brought to [facility] to be closer to her family.  She was referred to cardiology on 6/24 for abnormal echocardiogram.  Outside notes showed moderate basal septal hypertrophy, formal report unavailable for review.  Repeat echocardiogram revealed an LVEF of 60 to 65%, no RWMA, G1 DD, small pericardial effusion.  She presented to the [facility] emergency department on 04/22/2023 after she sustained a mechanical fall at assisted living.  She woke up to go to the bathroom and somehow fell she does not remember the details.  She remembers feeling dizzy and lightheaded while walking.  She remembers having some weakness in the days before the fall she states she did not hit her head or lose consciousness.  She denied any chest pain, shortness of breath or palpitations.  In the emergency department EKG revealed atrial fibrillation with RVR with rates in the 160s.  Blood pressure was 120/74, respirations of 22, she was afebrile, with 100% oxygen saturation.  Pertinent labs revealed blood glucose of 119, TSH 5.62, free T41.27, high-sensitivity troponin 15-3 05 to 906.  X-ray showed mild thoracic compression fracture that was nonacute.  She was started on IV diltiazem  and admitted for further workup.  She was able to be transitioned from IV Cardizem to long-acting Cardizem 180 mg daily and was started on apixaban 2.5 mg twice daily for CHA2DS2-VASc score of at least 4 for stroke prophylaxis.  Echocardiogram was completed which revealed an LVEF of 60-65%, no RWMA, G1 DD, mild to moderate mitral regurgitation, mild to moderate tricuspid regurgitation, and aortic valve regurgitation was mild.  Patient spontaneously converted back to sinus rhythm.  She was considered stable for discharge and discharged back to [facility] on 04/25/2023.  She returns to clinic today posthospital discharge accompanied by family.  She states that she is tired and shaky today with continued back discomfort and is asking for pain medication.  She says typically her pain has been managed with Tylenol but with the extra movements today the pain is worsened.  She states that she has been unable to sleep in the bed due to discomfort to her back and has been sleeping in a lift chair.  She also notes a decrease in her appetite.  She has tolerated her medications well without any adverse side effects.  She is on apixaban 2.5 mg twice daily without any blood noted in her urine or stool but she did note that when she blows her nose she occasionally has some blood but it is short lived and resolves on its own.  ROS: 10 point review of systems have been reviewed and considered negative with exception what is been listed in the HPI  Studies Reviewed: Marland Kitchen   EKG Interpretation Date/Time:  Tuesday April 29 2023 10:27:18 EDT Ventricular Rate:  94 PR Interval:  146 QRS Duration:  82 QT Interval:  350 QTC Calculation: 437 R Axis:   48  Text Interpretation: Normal sinus rhythm Normal ECG When compared with ECG of 22-Apr-2023 04:06, PREVIOUS ECG IS PRESENT Confirmed by Charlsie Quest ([id number]) on 04/29/2023 10:33:55 AM    Echo 12/2022  1. Left ventricular ejection fraction, by estimation, is 60 to 65%. The   left ventricle has normal function. The left ventricle has no regional  wall motion abnormalities. Left ventricular diastolic parameters are  consistent with Grade I diastolic  dysfunction (impaired relaxation).   2. Right ventricular systolic function is normal. The right ventricular  size is normal. There is normal pulmonary artery systolic pressure. The  estimated right ventricular systolic pressure is 29.0 mmHg.   3. A small pericardial effusion is present. There is no evidence of  cardiac tamponade.   4. The mitral valve is normal in structure. Mild to moderate mitral valve  regurgitation. No evidence of mitral stenosis.   5. Tricuspid valve regurgitation is mild to moderate.   6. The aortic valve has an indeterminant number of cusps. Aortic valve  regurgitation is mild. No aortic stenosis is present.   7. There is borderline dilatation of the aortic root, measuring 37 mm.   8. The inferior vena cava is normal in size with <50% respiratory  variability, suggesting right atrial pressure of 8 mmHg.  Risk Assessment/Calculations:    CHA2DS2-VASc Score = 4   This indicates a 4.8% annual risk of stroke. The patient's score is based upon: CHF History: 0 HTN History: 1 Diabetes History: 0 Stroke History: 0 Vascular Disease History: 0 Age Score: 2 Gender Score: 1            Physical Exam:   VS:  BP 124/70 (BP Location: Left Arm, Patient Position: Sitting, Cuff Size: Normal)   Pulse 94   Ht 5' 3.5" (1.613 m)   Wt 117 lb (53.1 kg)   SpO2 98%   BMI 20.40 kg/m    Wt Readings from Last 3 Encounters:  04/29/23 117 lb (53.1 kg)  04/22/23 119 lb 0.8 oz (54 kg)  12/25/22 117 lb (53.1 kg)    GEN: Well nourished, well developed in no acute distress NECK: No JVD; No carotid bruits CARDIAC: RRR, no murmurs, rubs, gallops RESPIRATORY:  Clear to auscultation without rales, wheezing or rhonchi  ABDOMEN: Soft, non-tender, non-distended EXTREMITIES: Trace pretibial edema; No deformity    ASSESSMENT AND PLAN: .   New onset atrial fibrillation found during recent hospitalization.  Spontaneously converted to sinus rhythm.  Remains in sinus today with a EKG revealed sinus rhythm with a rate of 94.  She has been continued on diltiazem 120 mg daily and apixaban 2.5 mg twice daily for CHA2DS2-VASc score of at least 4 for stroke prophylaxis.  She denies any blood in her urine or stool.  She will have a CBC and a BMP today.  Hypertension with blood pressure today 120/70.  She is continued on diltiazem 120 mg daily.  Blood pressure has been controlled.  Continued with blood pressure surveillance at [facility].  Compression fracture to L2 causing increased pain.  Advised the patient and family to follow-up with [facility] NP for continued pain management.  Also advised that she does have muscle relaxers already as needed on her medication list but she must request medication prior to being administered.  This will need to be managed by her  PCP.  Hyperlipidemia where she is continued on rosuvastatin 20 mg daily.  This continues to be managed by her PCP.       Dispo: Patient to return to clinic to see MD/APP in 6 to 8 weeks or sooner if needed  Signed, Tierra Divelbiss, NP

## 2023-04-29 NOTE — Patient Instructions (Signed)
Medication Instructions:  Your physician recommends that you continue on your current medications as directed. Please refer to the Current Medication list given to you today.  *If you need a refill on your cardiac medications before your next appointment, please call your pharmacy*  Lab Work: Your provider would like for you to have following labs drawn today CBC & BMET.   If you have labs (blood work) drawn today and your tests are completely normal, you will receive your results only by: MyChart Message (if you have MyChart) OR A paper copy in the mail If you have any lab test that is abnormal or we need to change your treatment, we will call you to review the results.  Testing/Procedures: -None ordered  Follow-Up: At [facility], you and your health needs are our priority.  As part of our continuing mission to provide you with exceptional heart care, we have created designated Provider Care Teams.  These Care Teams include your primary Cardiologist (physician) and Advanced Practice Providers (APPs -  Physician Assistants and Nurse Practitioners) who all work together to provide you with the care you need, when you need it.  Your next appointment:   6 - 8 week(s)  Provider:   Charlsie Quest, NP    Other Instructions -None

## 2023-04-30 LAB — BASIC METABOLIC PANEL
BUN/Creatinine Ratio: 17 (ref 12–28)
BUN: 13 mg/dL (ref 10–36)
CO2: 21 mmol/L (ref 20–29)
Calcium: 9.5 mg/dL (ref 8.7–10.3)
Chloride: 101 mmol/L (ref 96–106)
Creatinine, Ser: 0.77 mg/dL (ref 0.57–1.00)
Glucose: 111 mg/dL — ABNORMAL HIGH (ref 70–99)
Potassium: 4.4 mmol/L (ref 3.5–5.2)
Sodium: 138 mmol/L (ref 134–144)
eGFR: 72 mL/min/{1.73_m2} (ref >59)

## 2023-04-30 LAB — CBC
Hematocrit: 37.4 % (ref 34.0–46.6)
Hemoglobin: 12.3 g/dL (ref 11.1–15.9)
MCH: 29.7 pg (ref 26.6–33.0)
MCHC: 32.9 g/dL (ref 31.5–35.7)
MCV: 90 fL (ref 79–97)
Platelets: 323 10*3/uL (ref 150–450)
RBC: 4.14 x10E6/uL (ref 3.77–5.28)
RDW: 13.1 % (ref 11.7–15.4)
WBC: 7.4 10*3/uL (ref 3.4–10.8)

## 2023-04-30 NOTE — Progress Notes (Signed)
Blood counts have improved since hospital discharge. Kidney function and electrolytes remains stable. Continue with current medication regimen without changes at this time.

## 2023-06-10 ENCOUNTER — Encounter: Payer: Self-pay | Admitting: Cardiology

## 2023-06-10 ENCOUNTER — Ambulatory Visit: Payer: Medicare Other | Attending: Cardiology | Admitting: Cardiology

## 2023-06-10 VITALS — BP 128/60 | HR 92 | Ht 63.0 in | Wt 117.6 lb

## 2023-06-10 DIAGNOSIS — I480 Paroxysmal atrial fibrillation: Secondary | ICD-10-CM | POA: Insufficient documentation

## 2023-06-10 DIAGNOSIS — S22000S Wedge compression fracture of unspecified thoracic vertebra, sequela: Secondary | ICD-10-CM | POA: Insufficient documentation

## 2023-06-10 DIAGNOSIS — I10 Essential (primary) hypertension: Secondary | ICD-10-CM | POA: Diagnosis present

## 2023-06-10 DIAGNOSIS — E782 Mixed hyperlipidemia: Secondary | ICD-10-CM | POA: Insufficient documentation

## 2023-06-10 NOTE — Patient Instructions (Signed)
Medication Instructions:  - No changes *If you need a refill on your cardiac medications before your next appointment, please call your pharmacy*  Lab Work: - None ordered  Testing/Procedures: - None ordered  Follow-Up: At [facility], you and your health needs are our priority.  As part of our continuing mission to provide you with exceptional heart care, we have created designated Provider Care Teams.  These Care Teams include your primary Cardiologist (physician) and Advanced Practice Providers (APPs -  Physician Assistants and Nurse Practitioners) who all work together to provide you with the care you need, when you need it.  Your next appointment:   4 - 5 month(s)  Provider:   Yvonne Kendall, MD or Charlsie Quest, NP

## 2023-06-10 NOTE — Progress Notes (Signed)
Cardiology Office Note:  .   Date:  06/10/2023  ID:  Connie Schwartz, DOB 07/18/1928, MRN [id number] PCP: Ellan Lambert, NP  [facility] Providers Cardiologist:  Yvonne Kendall, MD    History of Present Illness: .   Connie Schwartz is a 89 y.o. female with past medical open hypertension, GERD, osteoporosis complicated by multiple compression fractures and recent mechanical falls, hypercholesterolemia, who is here today for follow-up.   History of a fall in 2022.  She had hit her head but CT scans were negative but she was found to have a compression fracture of T1.  Workup is otherwise unremarkable.  She was brought to [facility] to be closer to her family after fall.  She was referred to cardiology in 6/24 for abnormal echocardiogram.  Outside notes showed moderate basal septal hypertrophy, formal report was unavailable for review.  Repeat echocardiogram revealed LVEF of 60-65%, no RWMA, G1 DD, and small pericardial effusion.  She presented to the emergency room on 04/24/2019.  She sustained a mechanical fall at assisted living.  EKG revealed atrial fibrillation with RVR rates 160s.  X-ray showed mild thoracic compression fracture that was nonacute.  She was started on IV diltiazem and admitted for further workup.  She was able to be transition from IV Cardizem to long-acting Cardizem and was started on apixaban 2.5 mg twice daily for CHA2DS2-VASc score of at least 4 for stroke prophylaxis.  Repeat echocardiogram was completed which revealed an LVEF of 60 to 65%, no RWMA, G1 DD, mild to moderate mitral regurgitation, mild to moderate tricuspid regurgitation, and aortic valve regurgitation was mild.  She spontaneous converted back to sinus rhythm.  She was discharged back to [facility] on 04/25/2023.   She was last seen in clinic 04/29/2023 after hospital discharge accompanied by her family.  She states she was tired and shaky and continued to have back discomfort and was asking for pain  medication.  She remains on apixaban 2.5 mg twice daily without any issues of bleeding with blood noted in her urine or stool.  She had follow-up blood work at the return but no other medication changes that were made and no further testing that was ordered as her EKG revealed she maintained sinus rhythm.  She returns to clinic today accompanied by her family. She states that overall she has been doing well. She continues to have some weakness, fatigue, and continued back pain that has slowly been improving. She has been doing less physical therapy to prevent aggravation of her back. She denies any bleeding with no noted blood in her stool or urine. She continues to reside at [facility]. Denies any chest pain, shortness of breath, palpitations, lightheadedness or dizziness. She denies any hospitalizations or visits to the emergency department.    ROS: 10 point review of systems has been reviewed and considered negative with exception of what is been listed in the HPI  Studies Reviewed: Marland Kitchen   EKG Interpretation Date/Time:  Tuesday June 10 2023 10:22:20 EST Ventricular Rate:  92 PR Interval:  146 QRS Duration:  84 QT Interval:  350 QTC Calculation: 432 R Axis:   51  Text Interpretation: Normal sinus rhythm Normal ECG When compared with ECG of 29-Apr-2023 10:27, No significant change was found Confirmed by Charlsie Quest ([id number]) on 06/10/2023 10:25:37 AM    2D echo 04/23/2023 1. Left ventricular ejection fraction, by estimation, is 60 to 65%. The  left ventricle has normal function. The left ventricle has no regional  wall motion abnormalities. There is mild left ventricular hypertrophy.  Left ventricular diastolic parameters  are consistent with Grade I diastolic dysfunction (impaired relaxation).   2. Right ventricular systolic function is normal. The right ventricular  size is normal. Mildly increased right ventricular wall thickness. There  is normal pulmonary artery systolic pressure.  The estimated right  ventricular systolic pressure is 34.6  mmHg.   3. The mitral valve is normal in structure. Moderate mitral valve  regurgitation. No evidence of mitral stenosis.   4. Tricuspid valve regurgitation is moderate.   5. The aortic valve is tricuspid. Aortic valve regurgitation is mild. No  aortic stenosis is present.   6. There is borderline dilatation of the aortic root, measuring 37 mm.   7. The inferior vena cava is normal in size with greater than 50%  respiratory variability, suggesting right atrial pressure of 3 mmHg.   Echo 12/2022  1. Left ventricular ejection fraction, by estimation, is 60 to 65%. The  left ventricle has normal function. The left ventricle has no regional  wall motion abnormalities. Left ventricular diastolic parameters are  consistent with Grade I diastolic  dysfunction (impaired relaxation).   2. Right ventricular systolic function is normal. The right ventricular  size is normal. There is normal pulmonary artery systolic pressure. The  estimated right ventricular systolic pressure is 29.0 mmHg.   3. A small pericardial effusion is present. There is no evidence of  cardiac tamponade.   4. The mitral valve is normal in structure. Mild to moderate mitral valve  regurgitation. No evidence of mitral stenosis.   5. Tricuspid valve regurgitation is mild to moderate.   6. The aortic valve has an indeterminant number of cusps. Aortic valve  regurgitation is mild. No aortic stenosis is present.   7. There is borderline dilatation of the aortic root, measuring 37 mm.   8. The inferior vena cava is normal in size with <50% respiratory  variability, suggesting right atrial pressure of 8 mmHg.  Risk Assessment/Calculations:    CHA2DS2-VASc Score = 4   This indicates a 4.8% annual risk of stroke. The patient's score is based upon: CHF History: 0 HTN History: 1 Diabetes History: 0 Stroke History: 0 Vascular Disease History: 0 Age Score: 2 Gender  Score: 1            Physical Exam:   VS:  BP 128/60 (BP Location: Left Arm, Patient Position: Sitting, Cuff Size: Normal)   Pulse 92   Ht 5\' 3"  (1.6 m)   Wt 117 lb 9.6 oz (53.3 kg)   SpO2 98%   BMI 20.83 kg/m    Wt Readings from Last 3 Encounters:  06/10/23 117 lb 9.6 oz (53.3 kg)  04/29/23 117 lb (53.1 kg)  04/22/23 119 lb 0.8 oz (54 kg)    GEN: Well nourished, well developed in no acute distress NECK: No JVD; No carotid bruits CARDIAC: RRR, no murmurs, rubs, gallops RESPIRATORY:  Clear to auscultation without rales, wheezing or rhonchi  ABDOMEN: Soft, non-tender, non-distended EXTREMITIES:  No edema; No deformity   ASSESSMENT AND PLAN: .   Paroxysmal atrial fibrillation with new onset atrial fibrillation found on recent hospitalization is spontaneously converted to sinus rhythm on IV diltiazem.  EKG today reveals sinus rhythm with a rate of 92 with no ischemic changes noted from prior studies.  She has continued on diltiazem 120 mg daily and apixaban 2.5 mg twice daily for CHA2DS2-VASc of at least 4 for stroke prophylaxis.  She denies  any blood in her urine or stool we are requesting her most recent labs from [facility].  Hypertension with blood pressure today 128/60.  She is continued on diltiazem 120 mg daily.  Blood pressure has been well-controlled.  Compression fracture of L2 causing pain that is improving in her lower back.  She has backed off of physical therapy at the facility to prevent aggravation to her back and continued pain management according to Plaquenil recommendations.  Hyperlipidemia where she is continued on rosuvastatin 20 mg daily.  We have requested labs for updated LDL.       Dispo: Patient to return to clinic to see MD/APP in 3-4 months or sooner if needed.  Signed, Amorie Rentz, NP

## 2023-10-09 ENCOUNTER — Encounter: Payer: Self-pay | Admitting: Cardiology

## 2023-10-09 ENCOUNTER — Ambulatory Visit: Payer: PRIVATE HEALTH INSURANCE | Attending: Cardiology | Admitting: Cardiology

## 2023-10-09 VITALS — BP 138/64 | HR 78 | Ht 63.0 in | Wt 118.6 lb

## 2023-10-09 DIAGNOSIS — I480 Paroxysmal atrial fibrillation: Secondary | ICD-10-CM

## 2023-10-09 DIAGNOSIS — I10 Essential (primary) hypertension: Secondary | ICD-10-CM | POA: Diagnosis present

## 2023-10-09 DIAGNOSIS — E782 Mixed hyperlipidemia: Secondary | ICD-10-CM

## 2023-10-09 NOTE — Patient Instructions (Signed)
 Medication Instructions:  Your physician recommends that you continue on your current medications as directed. Please refer to the Current Medication list given to you today.  *If you need a refill on your cardiac medications before your next appointment, please call your pharmacy*  Lab Work: No labs ordered today  If you have labs (blood work) drawn today and your tests are completely normal, you will receive your results only by: MyChart Message (if you have MyChart) OR A paper copy in the mail If you have any lab test that is abnormal or we need to change your treatment, we will call you to review the results.  Testing/Procedures: No test ordered today   Follow-Up: At [facility], you and your health needs are our priority.  As part of our continuing mission to provide you with exceptional heart care, our providers are all part of one team.  This team includes your primary Cardiologist (physician) and Advanced Practice Providers or APPs (Physician Assistants and Nurse Practitioners) who all work together to provide you with the care you need, when you need it.  Your next appointment:   3-4 month(s) With Dr End   Provider:   You may see Yvonne Kendall, MD or one of the following Advanced Practice Providers on your designated Care Team:   Nicolasa Ducking, NP Ames Dura, PA-C Eula Listen, PA-C Cadence Castlewood, PA-C Charlsie Quest, NP Carlos Levering, NP

## 2023-10-09 NOTE — Progress Notes (Signed)
 Cardiology Office Note:  .   Date:  10/09/2023  ID:  Connie Schwartz, DOB [birth year], MRN [id number] PCP: Ellan Lambert, NP   [facility]Care Providers Cardiologist:  Yvonne Kendall, MD    History of Present Illness: .   Connie Schwartz is a 89 y.o. female with a past medical history of essential hypertension, GERD, osteoporosis complicated by multiple compression fractures with mechanical falls, hypercholesteremia, who is here today for follow up.   History of a fall in 2022.  She had hit her head but CT scans were negative but she was found to have a compression fracture of T1.  Workup is otherwise unremarkable.  She was brought to [facility] to be closer to her family after fall.  She was referred to cardiology in 6/24 for abnormal echocardiogram.  Outside notes showed moderate basal septal hypertrophy, formal report was unavailable for review.  Repeat echocardiogram revealed LVEF of 60-65%, no RWMA, G1 DD, and small pericardial effusion.  She presented to the emergency room on 04/24/2019.  She sustained a mechanical fall at assisted living.  EKG revealed atrial fibrillation with RVR rates 160s.  X-ray showed mild thoracic compression fracture that was nonacute.  She was started on IV diltiazem and admitted for further workup.  She was able to be transition from IV Cardizem to long-acting Cardizem and was started on apixaban 2.5 mg twice daily for CHA2DS2-VASc score of at least 4 for stroke prophylaxis.  Repeat echocardiogram was completed which revealed an LVEF of 60 to 65%, no RWMA, G1 DD, mild to moderate mitral regurgitation, mild to moderate tricuspid regurgitation, and aortic valve regurgitation was mild.  She spontaneous converted back to sinus rhythm.  She was discharged back to [facility] on 04/25/2023.   She was last seen in clinic 06/10/2023. At that time she had been doing well. She continued to have fatigue, weakness,and back pain.  She was maintaining sinus rhythm and was continued on  her current medication regimen and no further testing was ordered at that time.  She returns to clinic today accompanied by family member.  She states that she has been doing well.  She says he recently had taken her off of her osteoporosis medication but no other changes were made and the rest of the medications.  She states she has been compliant with her apixaban without any missed doses she denies any bleeding with no blood noted in her urine or stool.  She continues to have some occasional back discomfort and double vision that is unchanged.  Her weight has remained stable.  She denies any hospitalizations or visits to the emergency department.  ROS: 10 point review of system is reviewed and considered negative except ones been listed in the HPI  Studies Reviewed: Marland Kitchen   EKG Interpretation Date/Time:  Thursday October 09 2023 13:29:30 EDT Ventricular Rate:  78 PR Interval:  160 QRS Duration:  84 QT Interval:  380 QTC Calculation: 433 R Axis:   27  Text Interpretation: Normal sinus rhythm Normal ECG When compared with ECG of 10-Jun-2023 10:22, No significant change was found Confirmed by Charlsie Quest ([id number]) on 10/09/2023 1:34:36 PM    2D echo 04/23/2023 1. Left ventricular ejection fraction, by estimation, is 60 to 65%. The  left ventricle has normal function. The left ventricle has no regional  wall motion abnormalities. There is mild left ventricular hypertrophy.  Left ventricular diastolic parameters  are consistent with Grade I diastolic dysfunction (impaired relaxation).   2. Right ventricular systolic function  is normal. The right ventricular  size is normal. Mildly increased right ventricular wall thickness. There  is normal pulmonary artery systolic pressure. The estimated right  ventricular systolic pressure is 34.6  mmHg.   3. The mitral valve is normal in structure. Moderate mitral valve  regurgitation. No evidence of mitral stenosis.   4. Tricuspid valve regurgitation is  moderate.   5. The aortic valve is tricuspid. Aortic valve regurgitation is mild. No  aortic stenosis is present.   6. There is borderline dilatation of the aortic root, measuring 37 mm.   7. The inferior vena cava is normal in size with greater than 50%  respiratory variability, suggesting right atrial pressure of 3 mmHg.    Echo 12/2022  1. Left ventricular ejection fraction, by estimation, is 60 to 65%. The  left ventricle has normal function. The left ventricle has no regional  wall motion abnormalities. Left ventricular diastolic parameters are  consistent with Grade I diastolic  dysfunction (impaired relaxation).   2. Right ventricular systolic function is normal. The right ventricular  size is normal. There is normal pulmonary artery systolic pressure. The  estimated right ventricular systolic pressure is 29.0 mmHg.   3. A small pericardial effusion is present. There is no evidence of  cardiac tamponade.   4. The mitral valve is normal in structure. Mild to moderate mitral valve  regurgitation. No evidence of mitral stenosis.   5. Tricuspid valve regurgitation is mild to moderate.   6. The aortic valve has an indeterminant number of cusps. Aortic valve  regurgitation is mild. No aortic stenosis is present.   7. There is borderline dilatation of the aortic root, measuring 37 mm.   8. The inferior vena cava is normal in size with <50% respiratory  variability, suggesting right atrial pressure of 8 mmHg.  Risk Assessment/Calculations:    CHA2DS2-VASc Score = 4   This indicates a 4.8% annual risk of stroke. The patient's score is based upon: CHF History: 0 HTN History: 1 Diabetes History: 0 Stroke History: 0 Vascular Disease History: 0 Age Score: 2 Gender Score: 1            Physical Exam:   VS:  BP 138/64   Pulse 78   Ht 5\' 3"  (1.6 m)   Wt 118 lb 9.6 oz (53.8 kg)   SpO2 99%   BMI 21.01 kg/m    Wt Readings from Last 3 Encounters:  10/09/23 118 lb 9.6 oz (53.8 kg)   06/10/23 117 lb 9.6 oz (53.3 kg)  04/29/23 117 lb (53.1 kg)    GEN: Well nourished, well developed in no acute distress NECK: No JVD; No carotid bruits CARDIAC: RRR, no murmurs, rubs, gallops RESPIRATORY:  Clear to auscultation without rales, wheezing or rhonchi  ABDOMEN: Soft, non-tender, non-distended EXTREMITIES:  No edema; No deformity   ASSESSMENT AND PLAN: .   Paroxysmal atrial fibrillation with onset atrial fibrillation found during hospitalization.  she spontaneously converted in the hospital to sinus rhythm on iv diltiazem.  ekg today reveals sinus rhythm with a rate of 78 with no acute changes.  she is continued on diltiazem 120 mg daily and apixaban 2.5 mg twice daily for cha2ds2-vasc of at least 4 for stroke prophylaxis.  she is on reduced dosing for age and weight.  Primary hypertension with a blood pressure 138/64.  She is continued to monitor 20 mg daily.  Blood pressures remain stable.  She has been encouraged to continue to have her blood pressure monitored  1 to 2 hours postmedication administration.  Mixed hyperlipidemia which she is continued on rosuvastatin 20 mg daily.  We have requested most recent labs from the facility which she resides.       Dispo: Patient return to clinic to see MD/APP in 3 to 4 months or sooner if needed  Signed, Jaquay Morneault, NP

## 2024-01-21 ENCOUNTER — Encounter: Payer: Self-pay | Admitting: Internal Medicine

## 2024-01-21 ENCOUNTER — Ambulatory Visit: Payer: PRIVATE HEALTH INSURANCE | Attending: Internal Medicine | Admitting: Internal Medicine

## 2024-01-21 VITALS — BP 136/72 | HR 69 | Ht 63.0 in | Wt 119.0 lb

## 2024-01-21 DIAGNOSIS — I10 Essential (primary) hypertension: Secondary | ICD-10-CM | POA: Diagnosis not present

## 2024-01-21 DIAGNOSIS — E782 Mixed hyperlipidemia: Secondary | ICD-10-CM | POA: Diagnosis not present

## 2024-01-21 DIAGNOSIS — I480 Paroxysmal atrial fibrillation: Secondary | ICD-10-CM | POA: Diagnosis not present

## 2024-01-21 NOTE — Progress Notes (Signed)
  Cardiology Office Note:  .   Date:  01/21/2024  ID:  Connie Schwartz, DOB 05-31-1929, MRN [id number] PCP: Brutus Delon SAUNDERS, NP  [facility] Providers Cardiologist:  Lonni Hanson, MD     History of Present Illness: .   Connie Schwartz is a 89 y.o. female with history of paroxysmal atrial fibrillation, hypertension, hyperlipidemia, GERD, and osteoporosis complicated by multiple compression fractures secondary to mechanical falls, who presents for follow-up of atrial fibrillation.  She was last seen in our office in April by Tylene Lunch, NP, at which time she was doing well.  She was maintained on low-dose diltiazem  and apixaban .  Today, [name] reports that she has been feeling fairly well.  She notes sporadic episodes of giving out when walking several laps around her assisted living community.  However, she has not had any frank chest pain nor palpitations.  She also denies dyspnea at rest or with normal activities.  She is not had any lightheadedness, falls, bleeding, or edema.  ROS: See HPI  Studies Reviewed: SABRA   EKG Interpretation Date/Time:  Wednesday January 21 2024 11:11:18 EDT Ventricular Rate:  69 PR Interval:  152 QRS Duration:  82 QT Interval:  392 QTC Calculation: 420 R Axis:   31  Text Interpretation: Normal sinus rhythm Normal ECG When compared with ECG of 09-Oct-2023 13:29, No significant change was found Confirmed by Floy Angert [phone]) on 01/21/2024 11:19:48 AM    TTE (04/23/2023): Normal LV size with mild LVH.  LVEF 60-65% with normal wall motion and grade 1 diastolic dysfunction.  Normal RV size with mild RVH.  Normal PA pressure.  Normal biatrial size.  No pericardial effusion.  Moderate mitral and tricuspid regurgitation.  Mild aortic regurgitation.  Borderline dilated aortic root, measuring 3.7 cm.  Normal CVP.  Risk Assessment/Calculations:    CHA2DS2-VASc Score = 4   This indicates a 4.8% annual risk of stroke. The patient's score is based upon: CHF  History: 0 HTN History: 1 Diabetes History: 0 Stroke History: 0 Vascular Disease History: 0 Age Score: 2 Gender Score: 1            Physical Exam:   VS:  BP 136/72   Pulse 69   Ht 5' 3 (1.6 m)   Wt 119 lb (54 kg)   SpO2 100%   BMI 21.08 kg/m    Wt Readings from Last 3 Encounters:  01/21/24 119 lb (54 kg)  10/09/23 118 lb 9.6 oz (53.8 kg)  06/10/23 117 lb 9.6 oz (53.3 kg)    General:  NAD. Neck: No JVD or HJR. Lungs: Clear to auscultation bilaterally without wheezes or crackles. Heart: Regular rate and rhythm without murmurs, rubs, or gallops. Abdomen: Soft, nontender, nondistended. Extremities: No lower extremity edema.  ASSESSMENT AND PLAN: .    Paroxysmal atrial fibrillation: [name] is maintaining sinus rhythm and feels well.  We will continue her current regimen of diltiazem  120 mg daily and apixaban  2.5 mg twice daily (reduced dose based on age and weight).  I will check a CBC and BMP today to ensure stable blood counts and renal function in the setting of long-term intake of chelation.  Hypertension: Blood pressure well-controlled today.  Continue current medications.  Hyperlipidemia: Continue rosuvastatin  with ongoing management through her PCP.    Dispo: Return to clinic in 6 months.  Signed, Lonni Hanson, MD

## 2024-01-21 NOTE — Patient Instructions (Signed)
 Medication Instructions:  Your physician recommends that you continue on your current medications as directed. Please refer to the Current Medication list given to you today.    *If you need a refill on your cardiac medications before your next appointment, please call your pharmacy*  Lab Work: Your provider would like for you to have following labs drawn today BMP, CBC.     Testing/Procedures: No test ordered today   Follow-Up: At [facility], you and your health needs are our priority.  As part of our continuing mission to provide you with exceptional heart care, our providers are all part of one team.  This team includes your primary Cardiologist (physician) and Advanced Practice Providers or APPs (Physician Assistants and Nurse Practitioners) who all work together to provide you with the care you need, when you need it.  Your next appointment:   6 month(s)  Provider:   You may see Lonni Hanson, MD or one of the following Advanced Practice Providers on your designated Care Team:   Lonni Meager, NP Lesley Maffucci, PA-C Bernardino Bring, PA-C Cadence Woodstock, PA-C Tylene Lunch, NP Barnie Hila, NP

## 2024-01-22 ENCOUNTER — Ambulatory Visit: Payer: Self-pay | Admitting: Internal Medicine

## 2024-01-22 LAB — BASIC METABOLIC PANEL WITH GFR
BUN/Creatinine Ratio: 22 (ref 12–28)
BUN: 22 mg/dL (ref 10–36)
CO2: 18 mmol/L — ABNORMAL LOW (ref 20–29)
Calcium: 9.9 mg/dL (ref 8.7–10.3)
Chloride: 104 mmol/L (ref 96–106)
Creatinine, Ser: 0.98 mg/dL (ref 0.57–1.00)
Glucose: 88 mg/dL (ref 70–99)
Potassium: 5.1 mmol/L (ref 3.5–5.2)
Sodium: 141 mmol/L (ref 134–144)
eGFR: 54 mL/min/1.73 — ABNORMAL LOW (ref >59)

## 2024-01-22 LAB — CBC
Hematocrit: 38.9 % (ref 34.0–46.6)
Hemoglobin: 12.8 g/dL (ref 11.1–15.9)
MCH: 30.8 pg (ref 26.6–33.0)
MCHC: 32.9 g/dL (ref 31.5–35.7)
MCV: 94 fL (ref 79–97)
Platelets: 223 x10E3/uL (ref 150–450)
RBC: 4.16 x10E6/uL (ref 3.77–5.28)
RDW: 12.9 % (ref 11.7–15.4)
WBC: 7.6 x10E3/uL (ref 3.4–10.8)

## 2024-09-15 ENCOUNTER — Ambulatory Visit: Admitting: Internal Medicine
